# Patient Record
Sex: Female | Born: 1950 | Race: White | Hispanic: No | Marital: Married | Smoking: Never smoker
Health system: Southern US, Community
[De-identification: ages and names within clinical notes are randomized; demographics above are authoritative.]

## PROBLEM LIST (undated history)

## (undated) DIAGNOSIS — Q2112 Patent foramen ovale: Secondary | ICD-10-CM

## (undated) DIAGNOSIS — I10 Essential (primary) hypertension: Secondary | ICD-10-CM

## (undated) DIAGNOSIS — I2510 Atherosclerotic heart disease of native coronary artery without angina pectoris: Secondary | ICD-10-CM

## (undated) DIAGNOSIS — N183 Chronic kidney disease, stage 3 unspecified: Secondary | ICD-10-CM

## (undated) DIAGNOSIS — I252 Old myocardial infarction: Secondary | ICD-10-CM

## (undated) DIAGNOSIS — I7300 Raynaud's syndrome without gangrene: Secondary | ICD-10-CM

## (undated) DIAGNOSIS — I509 Heart failure, unspecified: Secondary | ICD-10-CM

## (undated) DIAGNOSIS — G4733 Obstructive sleep apnea (adult) (pediatric): Secondary | ICD-10-CM

## (undated) DIAGNOSIS — F988 Other specified behavioral and emotional disorders with onset usually occurring in childhood and adolescence: Secondary | ICD-10-CM

## (undated) DIAGNOSIS — R55 Syncope and collapse: Secondary | ICD-10-CM

## (undated) DIAGNOSIS — Z79890 Hormone replacement therapy (postmenopausal): Secondary | ICD-10-CM

## (undated) DIAGNOSIS — F319 Bipolar disorder, unspecified: Secondary | ICD-10-CM

## (undated) DIAGNOSIS — R000 Tachycardia, unspecified: Secondary | ICD-10-CM

## (undated) DIAGNOSIS — I498 Other specified cardiac arrhythmias: Secondary | ICD-10-CM

## (undated) DIAGNOSIS — N289 Disorder of kidney and ureter, unspecified: Secondary | ICD-10-CM

## (undated) DIAGNOSIS — G47429 Narcolepsy in conditions classified elsewhere without cataplexy: Secondary | ICD-10-CM

## (undated) DIAGNOSIS — E039 Hypothyroidism, unspecified: Secondary | ICD-10-CM

## (undated) DIAGNOSIS — R011 Cardiac murmur, unspecified: Secondary | ICD-10-CM

## (undated) DIAGNOSIS — B1920 Unspecified viral hepatitis C without hepatic coma: Secondary | ICD-10-CM

## (undated) DIAGNOSIS — N2581 Secondary hyperparathyroidism of renal origin: Secondary | ICD-10-CM

## (undated) DIAGNOSIS — M797 Fibromyalgia: Secondary | ICD-10-CM

## (undated) DIAGNOSIS — F32A Depression, unspecified: Secondary | ICD-10-CM

## (undated) DIAGNOSIS — Z789 Other specified health status: Secondary | ICD-10-CM

## (undated) DIAGNOSIS — B1910 Unspecified viral hepatitis B without hepatic coma: Secondary | ICD-10-CM

## (undated) DIAGNOSIS — Z8719 Personal history of other diseases of the digestive system: Secondary | ICD-10-CM

## (undated) DIAGNOSIS — F329 Major depressive disorder, single episode, unspecified: Secondary | ICD-10-CM

## (undated) DIAGNOSIS — E785 Hyperlipidemia, unspecified: Secondary | ICD-10-CM

## (undated) DIAGNOSIS — M47819 Spondylosis without myelopathy or radiculopathy, site unspecified: Secondary | ICD-10-CM

## (undated) DIAGNOSIS — E119 Type 2 diabetes mellitus without complications: Secondary | ICD-10-CM

## (undated) DIAGNOSIS — K219 Gastro-esophageal reflux disease without esophagitis: Secondary | ICD-10-CM

## (undated) DIAGNOSIS — J45909 Unspecified asthma, uncomplicated: Secondary | ICD-10-CM

## (undated) DIAGNOSIS — C801 Malignant (primary) neoplasm, unspecified: Secondary | ICD-10-CM

## (undated) DIAGNOSIS — Q211 Atrial septal defect: Secondary | ICD-10-CM

## (undated) DIAGNOSIS — M109 Gout, unspecified: Secondary | ICD-10-CM

## (undated) DIAGNOSIS — G90A Postural orthostatic tachycardia syndrome (POTS): Secondary | ICD-10-CM

## (undated) DIAGNOSIS — I499 Cardiac arrhythmia, unspecified: Secondary | ICD-10-CM

## (undated) DIAGNOSIS — J449 Chronic obstructive pulmonary disease, unspecified: Secondary | ICD-10-CM

## (undated) DIAGNOSIS — L719 Rosacea, unspecified: Secondary | ICD-10-CM

## (undated) DIAGNOSIS — D649 Anemia, unspecified: Secondary | ICD-10-CM

## (undated) DIAGNOSIS — G2581 Restless legs syndrome: Secondary | ICD-10-CM

## (undated) DIAGNOSIS — I951 Orthostatic hypotension: Secondary | ICD-10-CM

## (undated) DIAGNOSIS — E8940 Asymptomatic postprocedural ovarian failure: Secondary | ICD-10-CM

## (undated) HISTORY — DX: Narcolepsy in conditions classified elsewhere without cataplexy: G47.429

## (undated) HISTORY — DX: Type 2 diabetes mellitus without complications: E11.9

## (undated) HISTORY — DX: Depression, unspecified: F32.A

## (undated) HISTORY — DX: Essential (primary) hypertension: I10

## (undated) HISTORY — DX: Other specified behavioral and emotional disorders with onset usually occurring in childhood and adolescence: F98.8

## (undated) HISTORY — DX: Rosacea, unspecified: L71.9

## (undated) HISTORY — PX: BREAST BIOPSY: SHX20

## (undated) HISTORY — PX: TUBAL LIGATION: SHX77

## (undated) HISTORY — PX: CARDIAC CATHETERIZATION: SHX172

## (undated) HISTORY — PX: ABDOMINAL HYSTERECTOMY: SHX81

## (undated) HISTORY — DX: Major depressive disorder, single episode, unspecified: F32.9

## (undated) HISTORY — DX: Restless legs syndrome: G25.81

## (undated) HISTORY — DX: Chronic kidney disease, stage 3 (moderate): N18.3

## (undated) HISTORY — DX: Raynaud's syndrome without gangrene: I73.00

## (undated) HISTORY — PX: FOOT SURGERY: SHX648

## (undated) HISTORY — DX: Unspecified asthma, uncomplicated: J45.909

## (undated) HISTORY — PX: PELVIC FLOOR REPAIR: SHX2192

## (undated) HISTORY — DX: Gastro-esophageal reflux disease without esophagitis: K21.9

## (undated) HISTORY — DX: Other specified health status: Z78.9

## (undated) HISTORY — PX: BLADDER SURGERY: SHX569

## (undated) HISTORY — DX: Spondylosis without myelopathy or radiculopathy, site unspecified: M47.819

## (undated) HISTORY — DX: Asymptomatic postprocedural ovarian failure: E89.40

## (undated) HISTORY — DX: Hormone replacement therapy: Z79.890

## (undated) HISTORY — DX: Secondary hyperparathyroidism of renal origin: N25.81

## (undated) HISTORY — DX: Unspecified viral hepatitis C without hepatic coma: B19.20

## (undated) HISTORY — PX: BREAST SURGERY: SHX581

## (undated) HISTORY — PX: OTHER SURGICAL HISTORY: SHX169

## (undated) HISTORY — DX: Fibromyalgia: M79.7

## (undated) HISTORY — DX: Other specified cardiac arrhythmias: I49.8

## (undated) HISTORY — DX: Old myocardial infarction: I25.2

## (undated) HISTORY — DX: Patent foramen ovale: Q21.12

## (undated) HISTORY — DX: Unspecified viral hepatitis B without hepatic coma: B19.10

## (undated) HISTORY — DX: Atrial septal defect: Q21.1

## (undated) HISTORY — DX: Bipolar disorder, unspecified: F31.9

## (undated) HISTORY — DX: Heart failure, unspecified: I50.9

## (undated) HISTORY — PX: APPENDECTOMY: SHX54

## (undated) HISTORY — DX: Gout, unspecified: M10.9

## (undated) HISTORY — DX: Personal history of other diseases of the digestive system: Z87.19

## (undated) HISTORY — DX: Tachycardia, unspecified: R00.0

## (undated) HISTORY — DX: Chronic kidney disease, stage 3 unspecified: N18.30

## (undated) HISTORY — DX: Hypothyroidism, unspecified: E03.9

## (undated) HISTORY — DX: Orthostatic hypotension: I95.1

## (undated) HISTORY — DX: Obstructive sleep apnea (adult) (pediatric): G47.33

## (undated) HISTORY — DX: Postural orthostatic tachycardia syndrome (POTS): G90.A

## (undated) HISTORY — PX: BUNIONECTOMY: SHX129

## (undated) HISTORY — DX: Atherosclerotic heart disease of native coronary artery without angina pectoris: I25.10

---

## 2006-06-29 DIAGNOSIS — Q2111 Secundum atrial septal defect: Secondary | ICD-10-CM | POA: Insufficient documentation

## 2006-06-29 DIAGNOSIS — E119 Type 2 diabetes mellitus without complications: Secondary | ICD-10-CM | POA: Insufficient documentation

## 2006-12-30 ENCOUNTER — Other Ambulatory Visit: Payer: Self-pay

## 2006-12-30 ENCOUNTER — Emergency Department: Payer: Self-pay | Admitting: Emergency Medicine

## 2007-01-02 ENCOUNTER — Other Ambulatory Visit: Payer: Self-pay

## 2007-01-02 ENCOUNTER — Emergency Department: Payer: Self-pay | Admitting: Internal Medicine

## 2008-07-15 ENCOUNTER — Emergency Department: Payer: Self-pay | Admitting: Unknown Physician Specialty

## 2009-03-18 ENCOUNTER — Emergency Department (HOSPITAL_COMMUNITY): Admission: EM | Admit: 2009-03-18 | Discharge: 2009-03-19 | Payer: Self-pay | Admitting: Emergency Medicine

## 2009-10-16 ENCOUNTER — Emergency Department: Payer: Self-pay | Admitting: Emergency Medicine

## 2009-10-26 ENCOUNTER — Ambulatory Visit: Payer: Self-pay | Admitting: Unknown Physician Specialty

## 2009-11-23 ENCOUNTER — Ambulatory Visit: Payer: Self-pay | Admitting: Specialist

## 2010-04-03 LAB — TSH: TSH: 1.872 u[IU]/mL (ref 0.350–4.500)

## 2010-04-03 LAB — HEPATIC FUNCTION PANEL
Albumin: 3.5 g/dL (ref 3.5–5.2)
Total Protein: 6.5 g/dL (ref 6.0–8.3)

## 2010-04-03 LAB — POCT I-STAT, CHEM 8
Calcium, Ion: 1.18 mmol/L (ref 1.12–1.32)
Chloride: 104 mEq/L (ref 96–112)
HCT: 34 % — ABNORMAL LOW (ref 36.0–46.0)
Potassium: 3.7 mEq/L (ref 3.5–5.1)
Sodium: 139 mEq/L (ref 135–145)

## 2010-04-03 LAB — DIFFERENTIAL
Basophils Relative: 1 % (ref 0–1)
Eosinophils Absolute: 0.2 10*3/uL (ref 0.0–0.7)
Neutrophils Relative %: 66 % (ref 43–77)

## 2010-04-03 LAB — CBC
MCHC: 33.5 g/dL (ref 30.0–36.0)
MCV: 93.6 fL (ref 78.0–100.0)
Platelets: 256 10*3/uL (ref 150–400)
WBC: 6.1 10*3/uL (ref 4.0–10.5)

## 2010-04-03 LAB — BRAIN NATRIURETIC PEPTIDE: Pro B Natriuretic peptide (BNP): <30 pg/mL (ref 0.0–100.0)

## 2010-10-02 DIAGNOSIS — G4733 Obstructive sleep apnea (adult) (pediatric): Secondary | ICD-10-CM | POA: Insufficient documentation

## 2010-10-02 DIAGNOSIS — I10 Essential (primary) hypertension: Secondary | ICD-10-CM | POA: Insufficient documentation

## 2010-10-02 DIAGNOSIS — R55 Syncope and collapse: Secondary | ICD-10-CM | POA: Insufficient documentation

## 2010-10-02 DIAGNOSIS — I2542 Coronary artery dissection: Secondary | ICD-10-CM | POA: Insufficient documentation

## 2010-10-02 DIAGNOSIS — E119 Type 2 diabetes mellitus without complications: Secondary | ICD-10-CM | POA: Insufficient documentation

## 2010-10-02 DIAGNOSIS — I219 Acute myocardial infarction, unspecified: Secondary | ICD-10-CM | POA: Insufficient documentation

## 2010-10-02 DIAGNOSIS — I350 Nonrheumatic aortic (valve) stenosis: Secondary | ICD-10-CM | POA: Insufficient documentation

## 2010-10-02 DIAGNOSIS — J45909 Unspecified asthma, uncomplicated: Secondary | ICD-10-CM | POA: Insufficient documentation

## 2010-10-31 DIAGNOSIS — M21619 Bunion of unspecified foot: Secondary | ICD-10-CM | POA: Insufficient documentation

## 2011-02-15 ENCOUNTER — Emergency Department: Payer: Self-pay | Admitting: Emergency Medicine

## 2011-05-24 DIAGNOSIS — M2040 Other hammer toe(s) (acquired), unspecified foot: Secondary | ICD-10-CM | POA: Insufficient documentation

## 2011-06-15 DIAGNOSIS — K469 Unspecified abdominal hernia without obstruction or gangrene: Secondary | ICD-10-CM | POA: Insufficient documentation

## 2011-06-15 DIAGNOSIS — N393 Stress incontinence (female) (male): Secondary | ICD-10-CM | POA: Insufficient documentation

## 2011-11-27 DIAGNOSIS — D649 Anemia, unspecified: Secondary | ICD-10-CM | POA: Insufficient documentation

## 2011-11-27 DIAGNOSIS — E785 Hyperlipidemia, unspecified: Secondary | ICD-10-CM | POA: Insufficient documentation

## 2011-11-27 DIAGNOSIS — M109 Gout, unspecified: Secondary | ICD-10-CM | POA: Insufficient documentation

## 2011-11-27 DIAGNOSIS — E039 Hypothyroidism, unspecified: Secondary | ICD-10-CM | POA: Insufficient documentation

## 2011-11-27 DIAGNOSIS — J449 Chronic obstructive pulmonary disease, unspecified: Secondary | ICD-10-CM | POA: Insufficient documentation

## 2012-01-18 DIAGNOSIS — K649 Unspecified hemorrhoids: Secondary | ICD-10-CM | POA: Insufficient documentation

## 2012-05-07 DIAGNOSIS — M797 Fibromyalgia: Secondary | ICD-10-CM | POA: Insufficient documentation

## 2012-05-07 DIAGNOSIS — Q249 Congenital malformation of heart, unspecified: Secondary | ICD-10-CM | POA: Insufficient documentation

## 2012-05-07 DIAGNOSIS — N8184 Pelvic muscle wasting: Secondary | ICD-10-CM | POA: Insufficient documentation

## 2012-10-07 DIAGNOSIS — F988 Other specified behavioral and emotional disorders with onset usually occurring in childhood and adolescence: Secondary | ICD-10-CM | POA: Insufficient documentation

## 2013-07-17 DIAGNOSIS — K589 Irritable bowel syndrome without diarrhea: Secondary | ICD-10-CM | POA: Insufficient documentation

## 2014-01-19 ENCOUNTER — Ambulatory Visit: Payer: Self-pay | Admitting: Family Medicine

## 2014-01-20 ENCOUNTER — Ambulatory Visit: Payer: Self-pay | Admitting: Family Medicine

## 2014-03-17 ENCOUNTER — Ambulatory Visit: Payer: Self-pay | Admitting: Family Medicine

## 2014-03-24 ENCOUNTER — Ambulatory Visit: Payer: Self-pay | Admitting: Family Medicine

## 2014-03-25 ENCOUNTER — Ambulatory Visit: Payer: Self-pay | Admitting: Family Medicine

## 2014-04-12 DIAGNOSIS — N1832 Chronic kidney disease, stage 3b: Secondary | ICD-10-CM | POA: Insufficient documentation

## 2014-04-12 DIAGNOSIS — Z789 Other specified health status: Secondary | ICD-10-CM | POA: Insufficient documentation

## 2014-04-12 DIAGNOSIS — N183 Chronic kidney disease, stage 3 unspecified: Secondary | ICD-10-CM | POA: Insufficient documentation

## 2014-04-12 DIAGNOSIS — L719 Rosacea, unspecified: Secondary | ICD-10-CM | POA: Insufficient documentation

## 2014-04-12 DIAGNOSIS — M479 Spondylosis, unspecified: Secondary | ICD-10-CM | POA: Insufficient documentation

## 2014-05-17 ENCOUNTER — Other Ambulatory Visit: Payer: Self-pay | Admitting: Family Medicine

## 2014-05-17 DIAGNOSIS — M25562 Pain in left knee: Secondary | ICD-10-CM

## 2014-06-03 ENCOUNTER — Ambulatory Visit
Admission: RE | Admit: 2014-06-03 | Discharge: 2014-06-03 | Disposition: A | Discharge status: Home / Self Care | Payer: PPO | Source: Ambulatory Visit | Attending: Family Medicine | Admitting: Family Medicine

## 2014-06-03 DIAGNOSIS — M11262 Other chondrocalcinosis, left knee: Secondary | ICD-10-CM | POA: Insufficient documentation

## 2014-06-03 DIAGNOSIS — M25562 Pain in left knee: Secondary | ICD-10-CM | POA: Diagnosis present

## 2014-06-03 DIAGNOSIS — M5136 Other intervertebral disc degeneration, lumbar region: Secondary | ICD-10-CM | POA: Insufficient documentation

## 2014-06-09 ENCOUNTER — Telehealth: Payer: Self-pay | Admitting: Family Medicine

## 2014-06-09 NOTE — Telephone Encounter (Signed)
Sent PA to her Insurance plan via CoverMyMeds on 06/09/2014. Response is pending. Will notify patient once response is received.

## 2014-06-09 NOTE — Telephone Encounter (Signed)
This PA requirement document was in [employer] EMR which was printed out prior to today. Jamie Murillo is aware of the PA and we are working on it but it may take longer than usual.

## 2014-06-09 NOTE — Telephone Encounter (Signed)
Patient states that her adderral (generic brand) is needing authorization from her insurance/pharmacy. Please call when complete.

## 2014-06-10 ENCOUNTER — Other Ambulatory Visit: Payer: Self-pay | Admitting: Physical Medicine and Rehabilitation

## 2014-06-10 DIAGNOSIS — M5416 Radiculopathy, lumbar region: Secondary | ICD-10-CM

## 2014-06-14 ENCOUNTER — Ambulatory Visit
Admission: RE | Admit: 2014-06-14 | Discharge: 2014-06-14 | Disposition: A | Discharge status: Home / Self Care | Payer: PPO | Source: Ambulatory Visit | Attending: Physical Medicine and Rehabilitation | Admitting: Physical Medicine and Rehabilitation

## 2014-06-14 DIAGNOSIS — M5416 Radiculopathy, lumbar region: Secondary | ICD-10-CM

## 2014-06-14 DIAGNOSIS — M5136 Other intervertebral disc degeneration, lumbar region: Secondary | ICD-10-CM | POA: Insufficient documentation

## 2014-06-14 DIAGNOSIS — M545 Low back pain: Secondary | ICD-10-CM | POA: Diagnosis present

## 2014-06-14 DIAGNOSIS — M4806 Spinal stenosis, lumbar region: Secondary | ICD-10-CM | POA: Diagnosis not present

## 2014-06-14 DIAGNOSIS — M79604 Pain in right leg: Secondary | ICD-10-CM | POA: Diagnosis present

## 2014-06-14 DIAGNOSIS — M5126 Other intervertebral disc displacement, lumbar region: Secondary | ICD-10-CM | POA: Insufficient documentation

## 2014-06-14 DIAGNOSIS — M79605 Pain in left leg: Secondary | ICD-10-CM | POA: Diagnosis present

## 2014-06-15 ENCOUNTER — Encounter: Payer: Self-pay | Admitting: Family Medicine

## 2014-06-15 ENCOUNTER — Telehealth: Payer: Self-pay | Admitting: Family Medicine

## 2014-06-15 DIAGNOSIS — G47419 Narcolepsy without cataplexy: Secondary | ICD-10-CM

## 2014-06-15 NOTE — Telephone Encounter (Signed)
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   I have now tried prescribing Adderal generic and Methylphenidate and Nuvigil.   Insurance would not cover Adderal generic or Methylphenidate.   They did cover Nuvigil which the patient has tried and she does not like how it made her feel. Is this the medication she wants to try again?

## 2014-06-15 NOTE — Telephone Encounter (Signed)
Patient called stating her prior authorization for the Adderall ER has been denied so she wants to go back to the original rx with higher dosage if possible.

## 2014-06-16 DIAGNOSIS — N2581 Secondary hyperparathyroidism of renal origin: Secondary | ICD-10-CM | POA: Insufficient documentation

## 2014-06-16 DIAGNOSIS — F329 Major depressive disorder, single episode, unspecified: Secondary | ICD-10-CM | POA: Insufficient documentation

## 2014-06-16 DIAGNOSIS — Q211 Atrial septal defect: Secondary | ICD-10-CM | POA: Insufficient documentation

## 2014-06-16 DIAGNOSIS — I7300 Raynaud's syndrome without gangrene: Secondary | ICD-10-CM | POA: Insufficient documentation

## 2014-06-16 DIAGNOSIS — I2510 Atherosclerotic heart disease of native coronary artery without angina pectoris: Secondary | ICD-10-CM | POA: Insufficient documentation

## 2014-06-16 DIAGNOSIS — Z789 Other specified health status: Secondary | ICD-10-CM | POA: Insufficient documentation

## 2014-06-16 DIAGNOSIS — I5032 Chronic diastolic (congestive) heart failure: Secondary | ICD-10-CM | POA: Insufficient documentation

## 2014-06-16 DIAGNOSIS — K219 Gastro-esophageal reflux disease without esophagitis: Secondary | ICD-10-CM | POA: Insufficient documentation

## 2014-06-16 DIAGNOSIS — G2581 Restless legs syndrome: Secondary | ICD-10-CM | POA: Insufficient documentation

## 2014-06-16 DIAGNOSIS — Z8719 Personal history of other diseases of the digestive system: Secondary | ICD-10-CM | POA: Insufficient documentation

## 2014-06-16 DIAGNOSIS — F4310 Post-traumatic stress disorder, unspecified: Secondary | ICD-10-CM | POA: Insufficient documentation

## 2014-06-16 DIAGNOSIS — G47419 Narcolepsy without cataplexy: Secondary | ICD-10-CM | POA: Insufficient documentation

## 2014-06-16 DIAGNOSIS — I739 Peripheral vascular disease, unspecified: Secondary | ICD-10-CM | POA: Insufficient documentation

## 2014-06-16 DIAGNOSIS — Q2112 Patent foramen ovale: Secondary | ICD-10-CM | POA: Insufficient documentation

## 2014-06-16 DIAGNOSIS — E8940 Asymptomatic postprocedural ovarian failure: Secondary | ICD-10-CM | POA: Insufficient documentation

## 2014-06-16 DIAGNOSIS — M2550 Pain in unspecified joint: Secondary | ICD-10-CM | POA: Insufficient documentation

## 2014-06-16 MED ORDER — AMPHETAMINE-DEXTROAMPHETAMINE 20 MG PO TABS: 20.0000 mg | ORAL_TABLET | Freq: Every day | ORAL | Status: DC

## 2014-06-16 NOTE — Telephone Encounter (Signed)
Patient spoke to her insurance company last night and they informed her that they will cover the generic Adderal and that Dr. Nadine Counts could just increase the dosage. She was told that the Adderal ER was not covered because the diagnosis was not sufficient.

## 2014-06-16 NOTE — Telephone Encounter (Signed)
Contacted patient to inform her that her rx is ready for pick up.

## 2014-06-16 NOTE — Telephone Encounter (Signed)
Generic Adderall 20mg  one a day quantity #30 printed out and ready for patient to pick up.

## 2014-06-18 ENCOUNTER — Other Ambulatory Visit: Payer: Self-pay | Admitting: Family Medicine

## 2014-06-18 DIAGNOSIS — G47419 Narcolepsy without cataplexy: Secondary | ICD-10-CM

## 2014-06-18 MED ORDER — AMPHETAMINE-DEXTROAMPHETAMINE 20 MG PO TABS: 20.0000 mg | ORAL_TABLET | Freq: Two times a day (BID) | ORAL | Status: DC

## 2014-06-19 ENCOUNTER — Encounter: Payer: Self-pay | Admitting: Family Medicine

## 2014-06-20 ENCOUNTER — Encounter: Payer: Self-pay | Admitting: Family Medicine

## 2014-06-21 ENCOUNTER — Encounter: Payer: Self-pay | Admitting: Family Medicine

## 2014-06-21 ENCOUNTER — Other Ambulatory Visit: Payer: Self-pay

## 2014-06-21 ENCOUNTER — Other Ambulatory Visit: Payer: Self-pay | Admitting: Family Medicine

## 2014-06-21 ENCOUNTER — Telehealth: Payer: Self-pay

## 2014-06-21 DIAGNOSIS — F988 Other specified behavioral and emotional disorders with onset usually occurring in childhood and adolescence: Secondary | ICD-10-CM

## 2014-06-21 DIAGNOSIS — G47419 Narcolepsy without cataplexy: Secondary | ICD-10-CM

## 2014-06-21 DIAGNOSIS — L719 Rosacea, unspecified: Secondary | ICD-10-CM

## 2014-06-21 MED ORDER — MINOCYCLINE HCL 100 MG PO CAPS: 100.0000 mg | ORAL_CAPSULE | Freq: Every day | ORAL | Status: DC

## 2014-06-21 NOTE — Telephone Encounter (Signed)
error 

## 2014-07-08 ENCOUNTER — Encounter: Payer: Self-pay | Admitting: Psychiatry

## 2014-07-08 ENCOUNTER — Encounter: Payer: Self-pay | Admitting: Family Medicine

## 2014-07-08 ENCOUNTER — Ambulatory Visit (INDEPENDENT_AMBULATORY_CARE_PROVIDER_SITE_OTHER): Payer: PPO | Admitting: Psychiatry

## 2014-07-08 VITALS — BP 118/76 | HR 76 | Temp 97.8°F | Ht 62.0 in | Wt 160.6 lb

## 2014-07-08 DIAGNOSIS — F330 Major depressive disorder, recurrent, mild: Secondary | ICD-10-CM

## 2014-07-08 DIAGNOSIS — F909 Attention-deficit hyperactivity disorder, unspecified type: Secondary | ICD-10-CM | POA: Diagnosis not present

## 2014-07-08 DIAGNOSIS — F988 Other specified behavioral and emotional disorders with onset usually occurring in childhood and adolescence: Secondary | ICD-10-CM
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
## 2014-07-09 ENCOUNTER — Other Ambulatory Visit: Payer: Self-pay | Admitting: Family Medicine

## 2014-07-09 DIAGNOSIS — G47419 Narcolepsy without cataplexy: Secondary | ICD-10-CM

## 2014-07-09 MED ORDER — AMPHETAMINE-DEXTROAMPHETAMINE 20 MG PO TABS: 20.0000 mg | ORAL_TABLET | Freq: Two times a day (BID) | ORAL | Status: DC

## 2014-07-19 ENCOUNTER — Ambulatory Visit: Payer: Self-pay | Admitting: Psychiatry

## 2014-07-21 ENCOUNTER — Institutional Professional Consult (permissible substitution): Payer: Self-pay | Admitting: Neurology

## 2014-07-21 ENCOUNTER — Ambulatory Visit (INDEPENDENT_AMBULATORY_CARE_PROVIDER_SITE_OTHER): Payer: PPO | Admitting: Neurology

## 2014-07-21 ENCOUNTER — Encounter: Payer: Self-pay | Admitting: Diagnostic Neuroimaging

## 2014-07-21 ENCOUNTER — Encounter: Payer: Self-pay | Admitting: Neurology

## 2014-07-21 ENCOUNTER — Encounter (INDEPENDENT_AMBULATORY_CARE_PROVIDER_SITE_OTHER): Payer: PPO | Admitting: Diagnostic Neuroimaging

## 2014-07-21 VITALS — BP 107/63 | HR 76 | Resp 20 | Ht 62.0 in | Wt 161.8 lb

## 2014-07-21 DIAGNOSIS — G4710 Hypersomnia, unspecified: Secondary | ICD-10-CM | POA: Insufficient documentation

## 2014-07-21 DIAGNOSIS — Z9989 Dependence on other enabling machines and devices: Secondary | ICD-10-CM

## 2014-07-21 DIAGNOSIS — G2581 Restless legs syndrome: Secondary | ICD-10-CM | POA: Diagnosis not present

## 2014-07-21 DIAGNOSIS — G4730 Sleep apnea, unspecified: Secondary | ICD-10-CM

## 2014-07-21 DIAGNOSIS — G47411 Narcolepsy with cataplexy: Secondary | ICD-10-CM | POA: Diagnosis not present

## 2014-07-21 DIAGNOSIS — G4733 Obstructive sleep apnea (adult) (pediatric): Secondary | ICD-10-CM | POA: Insufficient documentation

## 2014-07-21 MED ORDER — IMIPRAMINE PAMOATE 75 MG PO CAPS: 75.0000 mg | ORAL_CAPSULE | Freq: Every day | ORAL | Status: DC

## 2014-07-21 MED ORDER — MODAFINIL 200 MG PO TABS: 200.0000 mg | ORAL_TABLET | Freq: Every day | ORAL | Status: DC

## 2014-07-21 NOTE — Progress Notes (Signed)
SLEEP MED[facility]INE CL[facility]IC   Provider:  Larey Seat, M D  Referring Provider: Bobetta Lime, MD Primary Care Physician:  Bobetta Lime, MD  Chief Complaint  Patient presents with  . sleep consult    rm 11, confirmed narcolepsy wth MSLT, alone    HPI:  Jamie Murillo is a 64 y.o. female  Is seen here as a referral from Dr. Nadine Counts . This is [place] has used CPAP since 2011 the studies I have available here are from 2016 generally and show that she is excessively hypersomnia or excessively daytime sleepy by being sufficiently treated on CPAP. The CPAP interpretation by Dr. Domenica Fail go showed a sleep time of 376.5 minutes a sleep latency of 15 minutes sleep efficiency of only 81.1% REM proportion was 28.3% and the AHI was only 5.4 per hour the RDI was 6.4 per hour. During REM sleep was increased to 10.1 and in supine position 5.4 AHI. The patient had borderline bradycardia average heart rate was 68.3 however. There were no periodic limb movements and she was titrated to 9 cm water pressure she also was tried on 10 cm water pressure and the technologist noted that she seemed to do better. It was recommended to use 10 cm water pressure CPAP as the most optimal pressure for her. Patient then was required to stay the next day on 01-20-14 for an MS LT interestingly she was given 5 nap opportunities sleep onsets were supposedly within less than a minute in each of her periods and she had REM onset supposedly and 4 out of 5 naps. 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 Is treated with CPAP prior to the MS LT so the has to be no need to re-titrate her.    Jamie Murillo is here today endorsing the Epworth sleepiness score 21/ 24 and endorsed the fatigue severity score at 63 pints. The patient has voluntarily stopped to drive.   Patient goes to bed between 10 and 11 PM, adheres to sleep hygiene rules. She sleeeps  supine, because of the CPAP . She was never tried on a nasal pillow.  She sleeps in a cool, quiet, and dark room, uses CPAP every night/  Sometimes she will find her self sleep walking, eating, . She will get up about 4.30 to help her husband to get ready, sh is retired.  She struggles all day with hypersomnia. She drinks 2 caffeineated sodas. Ice tea. No ETOH , no tobacco use.   She does not take scheduled naps, and had insomnia before. The current degree of sleepiness begun 3- years ago. In the last 18 month she gained 35 pounds!        Review of Systems: Out of a complete 14 system review, the patient complains of only the following symptoms, and all other reviewed systems are negative. Snoring when not using CPAP.  She reports very vivid dreams sometimes very real appearing dreams but also wake her up and fragment her sleep. As experienced sleep paralysis more than once, hypnagogic and hypnopompic hallucinations. Dream intrusions. Screaming in her dreams. Acting out dreams. When she is in emotionally taxing or upsetting situation , she noticed her knees to buckle.  The patient (anecdotal but she saw her little daughter touching the hot stove top with her hand and in her emotional shock she was not able to go to her daughter first she screamed out for her husband to get her.  So this would be a cataplectic example. When she was still driving she would chew  gum , pinch herself, and if the windows open and the radio on full blast -  all to no avail as she still would fall asleep.  Narcolepsy with catapley.  Her machine is broken. Marland Kitchen      History   Social History  . Marital Status: Married    Spouse Name: Philippa Chester  . Number of Children: 3  . Years of Education: 22   Occupational History  . unemployed    Social History Main Topics  . Smoking status: Never Smoker   . Smokeless tobacco: Never Used     Comment: tried smoking in youth  . Alcohol Use: No     Comment: used when she was  64 years old.   . Drug Use: Yes    Special: LSD, Marijuana     Comment: 07/21/14 "tried in youth"  . Sexual Activity: Yes    Birth Control/ Protection: None   Other Topics Concern  . Not on file   Social History Narrative   Married, lives at home with husband   Caffeine use- 1 soda, 1 tea daily    Family History  Problem Relation Age of Onset  . Hypertension Mother   . Hypothyroidism Mother   . Heart disease Mother   . Lung cancer Mother   . Alcohol abuse Mother   . Depression Mother   . Hypertension Father   . Heart disease Father   . Alcohol abuse Father   . Hypothyroidism Sister   . Hypertension Sister   . Bipolar disorder Sister   . Heart disease Brother   . Alcohol abuse Brother   . Bipolar disorder Brother   . Hypothyroidism Sister   . Hypertension Sister     Past Medical History  Diagnosis Date  . History of chronic hepatitis     Hepatitis B and C  . Raynaud's disease without gangrene   . Gout   . Secondary hyperparathyroidism, renal   . Postural orthostatic tachycardia syndrome   . Coronary artery disease with history of myocardial infarction without history of CABG   . Surgical menopause on hormone replacement therapy   . Restless leg syndrome, controlled   . CHF NYHA class I     chronic, diastolic  . 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 GERD without esophagitis   . Hypothyroidism, adult   . Chronic renal impairment, stage 3 (moderate)   . Obstructive sleep apnea, adult     CPAP  . Statin intolerance   . Rosacea   . Patent foramen ovale   . Chronic asthma   . Adult attention deficit disorder   . Bipolar disorder   . Depression   . Hepatitis B   . Hepatitis C   . Hypertension   . Diabetes mellitus without complication   . Fibromyalgia     Past Surgical History  Procedure Laterality Date  . Foot surgery Bilateral   . Breast surgery    . Pelvic floor  repair    . Abdominal hysterectomy    . Bilateral tubal ligation    . Bladder surgery      "repair"  . Bunionectomy Bilateral     Current Outpatient Prescriptions  Medication Sig Dispense Refill  . albuterol (PROAIR HFA) 108 (90 BASE) MCG/ACT inhaler Inhale into the lungs.    Marland Kitchen albuterol (  PROVENTIL) (2.5 MG/3ML) 0.083% nebulizer solution Inhale into the lungs.    Marland Kitchen allopurinol (ZYLOPRIM) 100 MG tablet Take by mouth.    Marland Kitchen ammonium lactate (LAC-HYDRIN) 12 % cream LAC-HYDRIN, 12% (External Cream) - Historical Medication  (12 %) Active    . amphetamine-dextroamphetamine (ADDERALL) 20 MG tablet Take 1 tablet (20 mg total) by mouth 2 (two) times daily. 60 tablet 0  . clindamycin (CLINDAGEL) 1 % gel CLINDAMYCIN PHOSPHATE, 1% (External Gel) - Historical Medication  (1 %) Active    . estradiol (ESTRACE) 0.5 MG tablet   0  . furosemide (LASIX) 20 MG tablet   0  . gabapentin (NEURONTIN) 300 MG capsule Take 600 mg by mouth at bedtime.  0  . levothyroxine (SYNTHROID, LEVOTHROID) 25 MCG tablet Take by mouth.    . losartan (COZAAR) 50 MG tablet Take by mouth.    . metoprolol succinate (TOPROL-XL) 50 MG 24 hr tablet   0  . minocycline (MINOCIN,DYNACIN) 100 MG capsule Take 1 capsule (100 mg total) by mouth daily. 90 capsule 1  . montelukast (SINGULAIR) 10 MG tablet Take 10 mg by mouth.    . Pramipexole Dihydrochloride 1.5 MG TB24 Take by mouth.    . SYMBICORT 160-4.5 MCG/ACT inhaler   0  . traMADol (ULTRAM) 50 MG tablet Take by mouth.    . tretinoin (RETIN-A) 0.05 % cream      No current facility-administered medications for this visit.    Allergies as of 07/21/2014 - Review Complete 07/21/2014  Allergen Reaction Noted  . Bee venom Anaphylaxis 07/08/2014  . Desvenlafaxine Rash and Shortness Of Breath 07/08/2014  . Diltiazem hcl Anaphylaxis 07/08/2014  . Neomycin Anaphylaxis 07/08/2014  . Other Anaphylaxis 07/08/2014  . Pneumococcal polysaccharide vaccine Dermatitis, Rash, and Swelling  07/08/2014  . Prednisone Anaphylaxis 07/08/2014  . Latex Rash 07/08/2014  . Ampicillin Hives 07/08/2014  . Erythromycin Hives 07/08/2014  . Influenza vaccines  07/08/2014  . Lamotrigine Hives 07/08/2014  . Penicillins Nausea And Vomiting 07/08/2014  . Pneumococcal vaccine Hives 07/08/2014  . Tetracycline Nausea And Vomiting 07/08/2014  . Buspirone Rash 07/08/2014  . Cortisone Rash 07/08/2014  . Imipramine Rash 07/08/2014  . Statins Rash 07/08/2014  . Tape Rash 07/08/2014  . Ziprasidone hcl Rash 07/08/2014    Vitals: BP 107/63 mmHg  Pulse 76  Resp 20  Ht 5\' 2"  (1.575 m)  Wt 161 lb 12.8 oz (73.392 kg)  BMI 29.59 kg/m2 Last Weight:  Wt Readings from Last 1 Encounters:  07/21/14 161 lb 12.8 oz (73.392 kg)       Last Height:   Ht Readings from Last 1 Encounters:  07/21/14 5\' 2"  (1.575 m)    Physical exam:  General: The patient is awake, alert and appears not in acute distress. The patient is well groomed. Head: Normocephalic, atraumatic. Neck is supple. Mallampati 3 , she has upper and lower dentures. ,  neck circumference: 15 . Nasal airflow unrestricted , TMJ is evident . Retrognathia is not seen.  Cardiovascular:  Regular rate and rhythm, without  murmurs or carotid bruit, and without distended neck veins. Respiratory: Lungs are clear to auscultation. Skin:  Without evidence of edema, or rash Trunk: BMI is elevated and patient  has normal posture.  Neurologic exam : The patient is awake and alert, oriented to place and time.   Memory subjective  described as intact. There is a normal attention span & concentration ability. Speech is fluent without  dysarthria, dysphonia or aphasia.  Mood and affect are  appropriate.  Cranial nerves: Pupils are equal and briskly reactive to light. Funduscopic exam without evidence of pallor or edema. Extraocular movements  in vertical and horizontal planes intact and without nystagmus. Visual fields by finger perimetry are  intact. Hearing to finger rub intact.  Facial sensation intact to fine touch. Facial motor strength is symmetric and tongue and uvula move midline.  Motor exam: Normal tone ,muscle bulk and symmetric ,strength in all extremities.  Sensory:  Fine touch, pinprick and vibration were tested in all extremities. Proprioception normal.  Coordination: Rapid alternating movements in the fingers/hands is normal.  Finger-to-nose maneuver normal without evidence of ataxia, dysmetria or tremor.  Gait and station: Patient walks without assistive device and is able unassisted to climb up to the exam table.  Strength within normal limits. Stance is stable and normal. Tandem gait is unfragmented. Romberg testing is negative.  Deep tendon reflexes: in the  upper and lower extremities are symmetric and intact. Babinski maneuver response is  downgoing.   Assessment:  After physical and neurologic examination, review of laboratory studies, imaging, neurophysiology testing and pre-existing records, assessment is   Jamie Murillo describes that she has suffered from obstructive sleep apnea for about 6 years. She is even noticed to snore when she wears her CPAP. The current CPAP pressure seems to be sufficient to reduce her AHI and she was recommended to use 10 cm water pressure but her current machine does not have correct downloadable abilities. The DME is upper via and she has not had regular downloaded data through DME. I'm unable to find a sleep study and the current chart that documents her sleep apnea untreated both studies that I could compare were CPAP re-titration's and an M SLT. The M SLT is very suspicious for narcolepsy and her clinical symptoms for cataplexy as well. She has not responded well to Adderall and I think she needs to be placed on modafinil and perhaps even Xyrem to achieve better control. Modafinil will only address the sleepiness and fatigue component but it will not address cataplexy. For this  part of the symptom complex we usually use imipramine in form of Tofranil. Her current degree of sleepiness would leave her unsafe to drive so medication and treatment change is necessary ASAP. The patient also advised me that she is allergic to imipramine. She developed a rash and a tightness of the throat when she took it the last time.   The patient was advised of the nature of the diagnosed sleep disorder , the treatment options and risks for general a health and wellness arising from not treating the condition. Visit duration was 40  minutes.   Plan:  Treatment plan and additional workup :  #1 the patient has documented sleep apnea but I don't know her baseline values. Her current CPAP machine is not working well she actually has broken parts. I would like for her to receive a new machine if possible. In addition her DME company is apnea and I will write for new supplies for her CPAP I would like for her to be fitted with a nasal pillow or nasal mask to see if this can further reduce her apnea index. This would also allow her not to sleep in supine but to resume a sleep position on the side or even on the belly. Normal many of my patients do respond well to a dream wear mask.   UNSURE , if currently covered by her Medicare plan health team advantage ;  Given  the M SLT results I do think that there is little doubt about her diagnosis of narcolepsy. What I will try today is to write for modafinil in form of Provigil or Nuvigil prefer generic form 200 mg once a day and foot Tofranil in p.m. only. I would like to see Jamie Murillo again in about a month see how she is doing on this medication and if she can tolerate the Tofranil at all if not it would be necessary for me to change her to Xyrem.  In addition Jamie Murillo carries a diagnosis of restless leg syndrome. She has been well treated with pramipexole Mirapex, she has been on higher doses in the past currently she is only taking 1 mg at night down  from 1.542 before. I am concerned that the restless leg component is actually a primary movement disorder and not truly restless legs and may be related to either subtherapeutic CPAP use or to the narcolepsy disorder.     Asencion Partridge Shay Bartoli MD  07/21/2014

## 2014-07-21 NOTE — Patient Instructions (Signed)
Narcolepsy Narcolepsy is a nervous system disorder that causes daytime sleepiness and sudden bouts of irresistible sleep during the day (sleep attacks). Many people with narcolepsy live with extreme daytime sleepiness for many years before being diagnosed and treated. Narcolepsy is a lifelong (chronic) disorder.  You normally go through cycles when you sleep. When your sleep becomes deeper, you have less body movement, and you start dreaming. This type of deep sleep should happen after about 90 minutes of lighter sleep. Deep sleep is called rapid eye movement (REM) sleep. When you have narcolepsy, REM is not well regulated. It can happen as soon as you fall asleep, and components of REM sleep can occur during the day when you are awake. Uncontrolled REM sleep causes symptoms of narcolepsy. CAUSES Narcolepsy may be caused by an abnormality with a chemical messenger (neurotransmitter) in your brain that controls your sleep and wake cycles. Most people with narcolepsy have low levels of the neurotransmitter hypocretin. Hypocretin is important for controlling wakefulness.  A hypocretin imbalance may be caused by abnormal genes that are passed down through families. It may also develop if the body's defense system (immune system) mistakenly attacks the brain cells that produce hypocretin (autoimmune disease). RISK FACTORS You may be at higher risk for narcolepsy if you have a family history of the disease. Other risk factors that may contribute include:  Injuries, tumors, or infections in the areas of the brain that control sleep.  Exposure to toxins.  Stress.  Hormones produced during puberty and menopause.  Poor sleep habits. SIGNS AND SYMPTOMS  Symptoms of narcolepsy can start at any age but usually begin when people are between the ages of 7 and 25 years. There are four major symptoms. Not everyone with narcolepsy will have all four.   Excessive daytime sleepiness. This is the most common symptom  and is usually the first symptom you will notice.  You may feel as if you are in a mental fog.  Daytime sleepiness may severely affect your performance at work or school.  You may fall asleep during a conversation or while eating dinner.  Sudden loss of muscle tone (cataplexy). You do not lose consciousness, but you may suddenly lose muscle control. When this occurs, your speech may become slurred, or your knees may buckle. This symptom is usually triggered by surprise, anger, or laughter.  Sleep paralysis. You may lose the ability to speak or move just as you start to fall asleep or wake up. You will be aware of the paralysis. It usually lasts for just a few seconds or minutes.  Vivid hallucinations. These may occur with sleep paralysis. The hallucinations are like having bizarre or frightening dreams while you are still awake. Other symptoms may include:   Trouble staying asleep at night (insomnia).  Restless sleep.  Feeling a strong urge to get up at night to smoke or eat. DIAGNOSIS  Your health care provider can diagnose narcolepsy based on your symptoms and the results of two diagnostic tests. You may also be asked to keep a sleep diary for several weeks. You may need the tests if you have had daytime sleepiness for at least 3 months. The two tests are:   A polysomnogram to find out how well your REM sleep is regulated at night. This test is an overnight sleep study. It measures your heart rate, breathing, movement, and brain waves.  A multiple sleep latency test (MSLT) to find out how well your REM sleep is regulated during the day. This   is a daytime sleep study. You may need to take several naps during the day. This test also measures your heart rate, breathing, movement, and brain waves. TREATMENT  There is no cure for narcolepsy, but treatment can be very effective in helping manage the condition. Treatment may include:  Lifestyle and sleeping strategies to help cope with the  condition.  Medicines. These may include:  Stimulant medicines to fight daytime sleepiness.  Antidepressant medicines to treat cataplexy.  Sodium oxybate. This is a strong sedative that you take at night. It can help daytime sleepiness and cataplexy. HOME CARE INSTRUCTIONS  Take all medicines as directed by your health care provider.  Follow these sleep practices:  Try to get about 8 hours of sleep every night.  Go to sleep and get up close to the same time every day.  Keep your bedroom dark, quiet, and comfortable.  Schedule short naps for when you feel sleepiest during the day. Tell your employer or teachers that you have narcolepsy. You may be able to adjust your schedule to include time for naps.  Try to get at least 20 minutes of exercise every day. This will help you sleep better at night and reduce daytime sleepiness.  Do not drink alcohol or caffeinated beverages within 4-5 hours of bedtime.  Do not eat a heavy meal before bedtime. Eat at about the same times every day.  Do not smoke.  Do not drive if you are sleepy or have untreated narcolepsy.  Do not swim or go out on the water without a life jacket. SEEK MEDICAL CARE IF: Your medicines are not controlling your narcolepsy symptoms. SEEK IMMEDIATE MEDICAL CARE IF:  You hurt yourself during a sleep attack or an attack of cataplexy.  You have chest pain or trouble breathing. Document Released: 12/15/2001 Document Revised: 05/11/2013 Document Reviewed: 12/17/2012 [facility] Patient Information 2015 [place], [place]. This information is not intended to replace advice given to you by your health care provider. Make sure you discuss any questions you have with your health care provider. Restless Legs Syndrome Restless legs syndrome is a movement disorder. It may also be called a sensorimotor disorder.  CAUSES  No one knows what specifically causes restless legs syndrome, but it tends to run in families. It is also more  common in people with low iron, in pregnancy, in people who need dialysis, and those with nerve damage (neuropathy).Some medications may make restless legs syndrome worse.Those medications include drugs to treat high blood pressure, some heart conditions, nausea, colds, allergies, and depression. SYMPTOMS Symptoms include uncomfortable sensations in the legs. These leg sensations are worse during periods of inactivity or rest. They are also worse while sitting or lying down. Individuals that have the disorder describe sensations in the legs that feel like:  Pulling.  Drawing.  Crawling.  Worming.  Boring.  Tingling.  Pins and needles.  Prickling.  Pain. The sensations are usually accompanied by an overwhelming urge to move the legs. Sudden muscle jerks may also occur. Movement provides temporary relief from the discomfort. In rare cases, the arms may also be affected. Symptoms may interfere with going to sleep (sleep onset insomnia). Restless legs syndrome may also be related to periodic limb movement disorder (PLMD). PLMD is another more common motor disorder. It also causes interrupted sleep. The symptoms from PLMD usually occur most often when you are awake. TREATMENT  Treatment for restless legs syndrome is symptomatic. This means that the symptoms are treated.   Massage and cold compresses  may provide temporary relief.  Walk, stretch, or take a cold or hot bath.  Get regular exercise and a good night's sleep.  Avoid caffeine, alcohol, nicotine, and medications that can make it worse.  Do activities that provide mental stimulation like discussions, needlework, and video games. These may be helpful if you are not able to walk or stretch. Some medications are effective in relieving the symptoms. However, many of these medications have side effects. Ask your caregiver about medications that may help your symptoms. Correcting iron deficiency may improve symptoms for some  patients. Document Released: 12/15/2001 Document Revised: 05/11/2013 Document Reviewed: 03/23/2010 [facility] Patient Information 2015 [place], [place]. This information is not intended to replace advice given to you by your health care provider. Make sure you discuss any questions you have with your health care provider.

## 2014-07-22 ENCOUNTER — Encounter: Payer: Self-pay | Admitting: Neurology

## 2014-07-23 ENCOUNTER — Other Ambulatory Visit: Payer: Self-pay | Admitting: Specialist

## 2014-07-23 ENCOUNTER — Other Ambulatory Visit: Payer: Self-pay

## 2014-07-23 DIAGNOSIS — I10 Essential (primary) hypertension: Secondary | ICD-10-CM

## 2014-07-23 DIAGNOSIS — M25562 Pain in left knee: Secondary | ICD-10-CM

## 2014-07-23 MED ORDER — METOPROLOL SUCCINATE ER 50 MG PO TB24: 50.0000 mg | ORAL_TABLET | Freq: Every day | ORAL | Status: DC

## 2014-07-23 NOTE — Telephone Encounter (Signed)
Refill request was sent to Dr. Ashany Sundaram for approval and submission.  

## 2014-07-26 NOTE — Progress Notes (Signed)
This encounter was created in error - please disregard.

## 2014-07-28 ENCOUNTER — Ambulatory Visit
Admission: RE | Admit: 2014-07-28 | Discharge: 2014-07-28 | Disposition: A | Discharge status: Home / Self Care | Payer: PPO | Source: Ambulatory Visit | Attending: Specialist | Admitting: Specialist

## 2014-07-28 DIAGNOSIS — M25462 Effusion, left knee: Secondary | ICD-10-CM | POA: Insufficient documentation

## 2014-07-28 DIAGNOSIS — M25562 Pain in left knee: Secondary | ICD-10-CM

## 2014-07-29 ENCOUNTER — Encounter: Payer: Self-pay | Admitting: Neurology

## 2014-07-30 ENCOUNTER — Encounter: Payer: Self-pay | Admitting: Neurology

## 2014-08-09 ENCOUNTER — Telehealth: Payer: Self-pay

## 2014-08-09 ENCOUNTER — Other Ambulatory Visit: Payer: Self-pay

## 2014-08-09 MED ORDER — AMPHETAMINE-DEXTROAMPHET ER 20 MG PO CP24: 20.0000 mg | ORAL_CAPSULE | Freq: Every day | ORAL | Status: DC

## 2014-08-09 NOTE — Telephone Encounter (Signed)
Pt's RX is ready for pick up at the front desk.

## 2014-08-11 ENCOUNTER — Encounter: Payer: Self-pay | Admitting: Neurology

## 2014-08-11 NOTE — Telephone Encounter (Signed)
Pt requesting her cpap referral be sent to [facility]. Sent pages to them, faxed to [phone].

## 2014-08-12 ENCOUNTER — Other Ambulatory Visit: Payer: Self-pay

## 2014-08-12 DIAGNOSIS — J4530 Mild persistent asthma, uncomplicated: Secondary | ICD-10-CM

## 2014-08-12 MED ORDER — ALBUTEROL SULFATE HFA 108 (90 BASE) MCG/ACT IN AERS: 1.0000 | INHALATION_SPRAY | RESPIRATORY_TRACT | Status: AC | PRN

## 2014-09-02 ENCOUNTER — Ambulatory Visit (INDEPENDENT_AMBULATORY_CARE_PROVIDER_SITE_OTHER): Payer: PPO | Admitting: Neurology

## 2014-09-02 ENCOUNTER — Encounter: Payer: Self-pay | Admitting: Neurology

## 2014-09-02 ENCOUNTER — Telehealth: Payer: Self-pay | Admitting: Neurology

## 2014-09-02 ENCOUNTER — Other Ambulatory Visit: Payer: Self-pay

## 2014-09-02 VITALS — BP 126/78 | HR 76 | Resp 20 | Ht 64.0 in | Wt 168.0 lb

## 2014-09-02 DIAGNOSIS — G2581 Restless legs syndrome: Secondary | ICD-10-CM

## 2014-09-02 DIAGNOSIS — G4733 Obstructive sleep apnea (adult) (pediatric): Secondary | ICD-10-CM | POA: Diagnosis not present

## 2014-09-02 DIAGNOSIS — G47411 Narcolepsy with cataplexy: Secondary | ICD-10-CM | POA: Diagnosis not present

## 2014-09-02 DIAGNOSIS — Z9989 Dependence on other enabling machines and devices: Secondary | ICD-10-CM

## 2014-09-02 DIAGNOSIS — R51 Headache: Secondary | ICD-10-CM | POA: Diagnosis not present

## 2014-09-02 DIAGNOSIS — R519 Headache, unspecified: Secondary | ICD-10-CM

## 2014-09-02 MED ORDER — SODIUM OXYBATE 500 MG/ML PO SOLN: 2750.0000 mg | Freq: Two times a day (BID) | ORAL | Status: DC

## 2014-09-02 NOTE — Progress Notes (Signed)
[facility]   Provider:  Larey Seat, M D  Referring Provider: Bobetta Lime, MD Primary Care Physician:  Idelle Crouch, MD  Chief Complaint  Patient presents with  . Follow-up    cpap, rm 10, alone    HPI:  Nekesha Font is a 64 y.o. female  Is seen here as a referral from Dr. Nadine Counts .  Mrs. Weinmann has used CPAP since 2011 the studies I have available here are from 2016 generally and show that she is excessively hypersomnia or excessively daytime sleepy by being sufficiently treated on CPAP. The CPAP interpretation showed a sleep time of 376.5 minutes , a sleep latency of 15 minutes , a sleep efficiency of only 81.1% , REM proportion was 28.3% and the AHI was only 5.4 per hour,  the RDI was 6.4 per hour.  During REM sleep AHI  was increased to 10.1 and in supine position 5.4 AHI.  The patient had borderline bradycardia , with an average heart rate was 68. BPM  however. There were no periodic limb movements . She was titrated to 9 cm water pressure she also was tried on 10 cm water pressure and the technologist noted that she seemed to do better.  It was recommended to use 10 cm water pressure CPAP as the most optimal pressure for her. Patient then was required to stay the next day on 01-20-14 for an MS LT interestingly she was given 5 nap opportunities sleep onsets were supposedly within less than a minute in each of her periods and she had REM onset supposedly and 4 out of 5 naps. 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  Patient is  treated with CPAP prior to the MS LT so the has to be no need to re-titrate her.  Patient goes to bed between 10 and 11 PM, adheres to sleep hygiene rules. She sleeeps supine, because of the CPAP . She was never tried on a nasal pillow.  She sleeps in a cool, quiet, and dark room, uses CPAP every night/  Sometimes she will find her self sleep  walking, eating, . She will get up about 4.30 to help her husband to get ready, sh is retired.  She struggles all day with hypersomnia. She drinks 2 caffeineated sodas. Ice tea. No ETOH , no tobacco use.   She does not take scheduled naps, and had insomnia before. The current degree of sleepiness begun 3- years ago. In the last 18 month she gained 35 pounds!   Interval history from 09-02-14- I still have no access to a baseline sleep study for this patient, and I'm given data from as early as 2016 reflecting a CPAP titration only but no baseline study. Given the difficulties we have to control the patient's excessive daytime sleepiness, she still endorses the Epworth Sleepiness Scale at 22 points and the fatigue severity scale at 61 points, it would have been helpful to at least obtain a download. These data are not available from her machine either. She is using currently a full face mask, AIR fit F , 10 by [employer]. Given her reported history of imipramine allergy she was not able to take Tofranil she stated. So I have no cataplexy controlling medication for this patient. She carries a diagnosis of ADD and the use of stimulants can treat both excessive daytime sleepiness and ADD, but it is not an optimal treatment option for a  lady of 64 years of age. Especially with heart disease  Blood pressure, obesity etc.         Review of Systems: Mrs. Marquess is here today endorsing the Epworth sleepiness score 22/ 24 and endorsed the fatigue severity score at 61 points. The patient has voluntarily stopped to drive.   Out of a complete 14 system review, the patient complains of only the following symptoms, and all other reviewed systems are negative. Snoring when not using CPAP.  She reports very vivid dreams  (sometimes very real appearing dreams ) . These  wake her up and fragment her sleep.  She experienced sleep paralysis more than once, hypnagogic and hypnopompic hallucinations. Dream intrusions.  Screaming in her dreams.  Acting out dreams. When she is in emotionally taxing or upsetting situation , she noticed her knees to buckle.  The patient (anecdotal but she saw her little daughter touching the hot stove top with her hand and in her emotional shock she was not able to go to her daughter )first she screamed out for her husband to get her. So this would be a cataplectic example. When she was still driving she would chew gum , pinch herself, and keep the car's windows open and the radio on full blast -  all to no avail as she still would fall asleep.       Social History   Social History  . Marital Status: Married    Spouse Name: Philippa Chester  . Number of Children: 3  . Years of Education: 22   Occupational History  . unemployed    Social History Main Topics  . Smoking status: Never Smoker   . Smokeless tobacco: Never Used     Comment: tried smoking in youth  . Alcohol Use: No     Comment: used when she was 64 years old.   . Drug Use: Yes    Special: LSD, Marijuana     Comment: 07/21/14 "tried in youth"  . Sexual Activity: Yes    Birth Control/ Protection: None   Other Topics Concern  . Not on file   Social History Narrative   Married, lives at home with husband   Caffeine use- 1 soda, 1 tea daily    Family History  Problem Relation Age of Onset  . Hypertension Mother   . Hypothyroidism Mother   . Heart disease Mother   . Lung cancer Mother   . Alcohol abuse Mother   . Depression Mother   . Hypertension Father   . Heart disease Father   . Alcohol abuse Father   . Hypothyroidism Sister   . Hypertension Sister   . Bipolar disorder Sister   . Heart disease Brother   . Alcohol abuse Brother   . Bipolar disorder Brother   . Hypothyroidism Sister   . Hypertension Sister     Past Medical History  Diagnosis Date  . History of chronic hepatitis     Hepatitis B and C  . Raynaud's disease without gangrene   . Gout   . Secondary hyperparathyroidism, renal   .  Postural orthostatic tachycardia syndrome   . Coronary artery disease with history of myocardial infarction without history of CABG   . Surgical menopause on hormone replacement therapy   . Restless leg syndrome, controlled   . CHF NYHA class I     chronic, diastolic  . 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  GERD without esophagitis   . Hypothyroidism, adult   . Chronic renal impairment, stage 3 (moderate)   . Obstructive sleep apnea, adult     CPAP  . Statin intolerance   . Rosacea   . Patent foramen ovale   . Chronic asthma   . Adult attention deficit disorder   . Bipolar disorder   . Depression   . Hepatitis B   . Hepatitis C   . Hypertension   . Diabetes mellitus without complication   . Fibromyalgia     Past Surgical History  Procedure Laterality Date  . Foot surgery Bilateral   . Breast surgery    . Pelvic floor repair    . Abdominal hysterectomy    . Bilateral tubal ligation    . Bladder surgery      "repair"  . Bunionectomy Bilateral     Current Outpatient Prescriptions  Medication Sig Dispense Refill  . albuterol (PROAIR HFA) 108 (90 BASE) MCG/ACT inhaler Inhale 1-2 puffs into the lungs every 4 (four) hours as needed for wheezing or shortness of breath. 1 Inhaler 3  . albuterol (PROVENTIL) (2.5 MG/3ML) 0.083% nebulizer solution Inhale into the lungs.    Marland Kitchen allopurinol (ZYLOPRIM) 100 MG tablet Take by mouth.    Marland Kitchen ammonium lactate (LAC-HYDRIN) 12 % cream LAC-HYDRIN, 12% (External Cream) - Historical Medication  (12 %) Active    . amphetamine-dextroamphetamine (ADDERALL) 20 MG tablet Take 1 tablet (20 mg total) by mouth 2 (two) times daily. 60 tablet 0  . clindamycin (CLINDAGEL) 1 % gel CLINDAMYCIN PHOSPHATE, 1% (External Gel) - Historical Medication  (1 %) Active    . estradiol (ESTRACE) 0.5 MG tablet   0  . furosemide (LASIX) 20 MG tablet   0  . gabapentin (NEURONTIN) 300  MG capsule Take 600 mg by mouth at bedtime.  0  . imipramine (TOFRANIL-PM) 75 MG capsule Take 1 capsule (75 mg total) by mouth at bedtime. 30 capsule 3  . levothyroxine (SYNTHROID, LEVOTHROID) 25 MCG tablet Take by mouth.    . losartan (COZAAR) 50 MG tablet Take by mouth.    . metoprolol succinate (TOPROL-XL) 50 MG 24 hr tablet Take 1 tablet (50 mg total) by mouth daily. 90 tablet 3  . minocycline (MINOCIN,DYNACIN) 100 MG capsule Take 1 capsule (100 mg total) by mouth daily. 90 capsule 1  . modafinil (PROVIGIL) 200 MG tablet Take 1 tablet (200 mg total) by mouth daily. 30 tablet 5  . Pramipexole Dihydrochloride 1.5 MG TB24 Take 1 mg by mouth.    . SYMBICORT 160-4.5 MCG/ACT inhaler   0  . traMADol (ULTRAM) 50 MG tablet Take by mouth.    . tretinoin (RETIN-A) 0.05 % cream     . amphetamine-dextroamphetamine (ADDERALL XR) 20 MG 24 hr capsule Take 1 capsule (20 mg total) by mouth daily. (Patient not taking: Reported on 09/02/2014) 30 capsule 0  . montelukast (SINGULAIR) 10 MG tablet Take 10 mg by mouth.     No current facility-administered medications for this visit.    Allergies as of 09/02/2014 - Review Complete 09/02/2014  Allergen Reaction Noted  . Bee venom Anaphylaxis 07/08/2014  . Desvenlafaxine Rash and Shortness Of Breath 07/08/2014  . Diltiazem hcl Anaphylaxis 07/08/2014  . Neomycin Anaphylaxis 07/08/2014  . Other Anaphylaxis 07/08/2014  . Pneumococcal polysaccharide vaccine Dermatitis, Rash, and Swelling 07/08/2014  . Prednisone Anaphylaxis 07/08/2014  . Latex Rash 07/08/2014  . Ampicillin Hives 07/08/2014  . Erythromycin Hives 07/08/2014  . Influenza vaccines  07/08/2014  .  Lamotrigine Hives 07/08/2014  . Penicillins Nausea And Vomiting 07/08/2014  . Pneumococcal vaccine Hives 07/08/2014  . Tetracycline Nausea And Vomiting 07/08/2014  . Buspirone Rash 07/08/2014  . Cortisone Rash 07/08/2014  . Imipramine Rash 07/08/2014  . Statins Rash 07/08/2014  . Tape Rash 07/08/2014    . Ziprasidone hcl Rash 07/08/2014    Vitals: BP 126/78 mmHg  Pulse 76  Resp 20  Ht 5\' 4"  (1.626 m)  Wt 168 lb (76.204 kg)  BMI 28.82 kg/m2 Last Weight:  Wt Readings from Last 1 Encounters:  09/02/14 168 lb (76.204 kg)       Last Height:   Ht Readings from Last 1 Encounters:  09/02/14 5\' 4"  (1.626 m)    Physical exam:  General: The patient is awake, alert and appears not in acute distress. The patient is well groomed. Head: Normocephalic, atraumatic. Neck is supple. Mallampati 3 , she has upper and lower dentures. ,  neck circumference: 15 . Nasal airflow unrestricted , TMJ is evident . Retrognathia is not seen. No goiter .  Cardiovascular:  Regular rate and rhythm, without  murmurs or carotid bruit, and without distended neck veins. Respiratory: Lungs are clear to auscultation. Skin:  Without evidence of edema, or rash Trunk: BMI is elevated and patient  has normal posture.  Neurologic exam : The patient is awake and alert, oriented to place and time.   Memory subjective  described as intact. There is a normal attention span & concentration ability. Speech is fluent without  dysarthria, dysphonia or aphasia.  Mood and affect are appropriate.  Cranial nerves:  no change in taste or smell. Pupils are equal and briskly reactive to light. Visual fields by finger perimetry are intact. Hearing to finger rub intact.  Facial motor strength is symmetric and tongue and uvula move midline.  Motor exam: Normal tone,muscle bulk and symmetric ,strength in all extremities.  Sensory:  Fine touch, pinprick and vibration were  normal.  Coordination: Rapid alternating movements in the fingers/hands is normal.  Finger-to-nose maneuver normal without evidence of ataxia, dysmetria or tremor.  Gait and station: Patient walks without assistive device and is able unassisted to climb up to the exam table.  Strength within normal limits. Stance is stable and normal. Deep tendon reflexes: in the   upper and lower extremities are symmetric and intact.    Assessment:  After physical and neurologic examination, review of laboratory studies, imaging, neurophysiology testing and pre-existing records, assessment is   Mrs. Vinsant describes that she has suffered from obstructive sleep apnea for about 6 years. She is even noticed to snore when she wears her CPAP.  The current CPAP pressure may be sufficient to reduce her AHI and she was recommended to use 10 cm water pressure-  but her current machine does not have wifi downloadable abilities. she has not had regular downloaded data through DME.  Both studies that I could compare were CPAP re-titration's , no baseline study- and an M SLT. The M SLT is very suspicious for narcolepsy and her clinical symptoms for cataplexy as well. She has not responded well to Adderall and I think she needs to be placed on modafinil and perhaps even Xyrem to achieve better control.  Modafinil will only address the sleepiness and fatigue component but it will not address cataplexy. She reported muscle cramping after taking modafinil.   The patient also advised me that she is allergic to imipramine. She developed a rash and a tightness of the throat when she  took it the last time.   The patient was advised of the nature of the diagnosed sleep disorder , the treatment options and risks for general a health and wellness arising from not treating the condition. Visit duration was 35  minutes.  Over 50% of our face-to-face time today is designated to the confirmation of the diagnosis of follow-up on her chief complaint of hypersomnia and high fatigue and to some degree assisting her managing her benefits through health team [employer]. She has been told that she is not entitled to a new machine also versus broken "". She still has hypersomnia , being treated for CPAP,  but we don't have compliance data.  Plan:  Treatment plan and additional workup :  #1 the patient has  documented sleep apnea but I still don't know her baseline values. Her current CPAP machine is not working well,  she actually has broken parts.  This is a REM STAR machine from 2012-13.   I would like for her to receive a new machine if possible.  I will now order a new SPLIT night study to finally get data.  In addition her DME company was [facility] and is now [facility] ,  and I will write for new supplies for her CPAP . I would like for her to be fitted with a nasal pillow or nasal mask to see if this can further reduce her apnea index. I also instructed her not to sleep in supine,  but to resume a sleep position on the side - this is difficult with a FFM. Marland Kitchen    2) Given the M SLT results I do think that there is little doubt about her diagnosis of narcolepsy.  What I will try today is to write for modafinil in form of Provigil or Nuvigil  (prefer generic form 200 mg once a day)  and Tofranil in p.m. only. She did not want to try tofranil , citing an allergic a reaction to Imipramine. Gust Brooms is now approved by insurance. Starter dose prescribed today . XYREM EDUCATION given, 30 minute.  3) In addition Mrs. Porath carries a diagnosis of restless leg syndrome. She has been well treated with pramipexole / Mirapex, she has been on higher doses in the past currently than she is only taking (1 mg at night) down from 1.5 MG before. I am concerned that the restless leg component is actually a primary movement disorderand may be related to either subtherapeutic CPAP use .   4) caries a diagnosis of ADD . May benefit from stimulants. This can be addressed with psychiatry .    Rv in 2 month after strating XYREM> SPLIT night ordered.   Asencion Partridge Cherina Dhillon MD  09/02/2014

## 2014-09-02 NOTE — Patient Instructions (Signed)
Narcolepsy Narcolepsy is a nervous system disorder that causes daytime sleepiness and sudden bouts of irresistible sleep during the day (sleep attacks). Many people with narcolepsy live with extreme daytime sleepiness for many years before being diagnosed and treated. Narcolepsy is a lifelong (chronic) disorder.  You normally go through cycles when you sleep. When your sleep becomes deeper, you have less body movement, and you start dreaming. This type of deep sleep should happen after about 90 minutes of lighter sleep. Deep sleep is called rapid eye movement (REM) sleep. When you have narcolepsy, REM is not well regulated. It can happen as soon as you fall asleep, and components of REM sleep can occur during the day when you are awake. Uncontrolled REM sleep causes symptoms of narcolepsy. CAUSES Narcolepsy may be caused by an abnormality with a chemical messenger (neurotransmitter) in your brain that controls your sleep and wake cycles. Most people with narcolepsy have low levels of the neurotransmitter hypocretin. Hypocretin is important for controlling wakefulness.  A hypocretin imbalance may be caused by abnormal genes that are passed down through families. It may also develop if the body's defense system (immune system) mistakenly attacks the brain cells that produce hypocretin (autoimmune disease). RISK FACTORS You may be at higher risk for narcolepsy if you have a family history of the disease. Other risk factors that may contribute include:  Injuries, tumors, or infections in the areas of the brain that control sleep.  Exposure to toxins.  Stress.  Hormones produced during puberty and menopause.  Poor sleep habits. SIGNS AND SYMPTOMS  Symptoms of narcolepsy can start at any age but usually begin when people are between the ages of 7 and 25 years. There are four major symptoms. Not everyone with narcolepsy will have all four.   Excessive daytime sleepiness. This is the most common symptom  and is usually the first symptom you will notice.  You may feel as if you are in a mental fog.  Daytime sleepiness may severely affect your performance at work or school.  You may fall asleep during a conversation or while eating dinner.  Sudden loss of muscle tone (cataplexy). You do not lose consciousness, but you may suddenly lose muscle control. When this occurs, your speech may become slurred, or your knees may buckle. This symptom is usually triggered by surprise, anger, or laughter.  Sleep paralysis. You may lose the ability to speak or move just as you start to fall asleep or wake up. You will be aware of the paralysis. It usually lasts for just a few seconds or minutes.  Vivid hallucinations. These may occur with sleep paralysis. The hallucinations are like having bizarre or frightening dreams while you are still awake. Other symptoms may include:   Trouble staying asleep at night (insomnia).  Restless sleep.  Feeling a strong urge to get up at night to smoke or eat. DIAGNOSIS  Your health care provider can diagnose narcolepsy based on your symptoms and the results of two diagnostic tests. You may also be asked to keep a sleep diary for several weeks. You may need the tests if you have had daytime sleepiness for at least 3 months. The two tests are:   A polysomnogram to find out how well your REM sleep is regulated at night. This test is an overnight sleep study. It measures your heart rate, breathing, movement, and brain waves.  A multiple sleep latency test (MSLT) to find out how well your REM sleep is regulated during the day. This   is a daytime sleep study. You may need to take several naps during the day. This test also measures your heart rate, breathing, movement, and brain waves. TREATMENT  There is no cure for narcolepsy, but treatment can be very effective in helping manage the condition. Treatment may include:  Lifestyle and sleeping strategies to help cope with the  condition.  Medicines. These may include:  Stimulant medicines to fight daytime sleepiness.  Antidepressant medicines to treat cataplexy.  Sodium oxybate. This is a strong sedative that you take at night. It can help daytime sleepiness and cataplexy. HOME CARE INSTRUCTIONS  Take all medicines as directed by your health care provider.  Follow these sleep practices:  Try to get about 8 hours of sleep every night.  Go to sleep and get up close to the same time every day.  Keep your bedroom dark, quiet, and comfortable.  Schedule short naps for when you feel sleepiest during the day. Tell your employer or teachers that you have narcolepsy. You may be able to adjust your schedule to include time for naps.  Try to get at least 20 minutes of exercise every day. This will help you sleep better at night and reduce daytime sleepiness.  Do not drink alcohol or caffeinated beverages within 4-5 hours of bedtime.  Do not eat a heavy meal before bedtime. Eat at about the same times every day.  Do not smoke.  Do not drive if you are sleepy or have untreated narcolepsy.  Do not swim or go out on the water without a life jacket. SEEK MEDICAL CARE IF: Your medicines are not controlling your narcolepsy symptoms. SEEK IMMEDIATE MEDICAL CARE IF:  You hurt yourself during a sleep attack or an attack of cataplexy.  You have chest pain or trouble breathing. Document Released: 12/15/2001 Document Revised: 05/11/2013 Document Reviewed: 12/17/2012 [facility] Patient Information 2015 [place], [place]. This information is not intended to replace advice given to you by your health care provider. Make sure you discuss any questions you have with your health care provider. Sodium Oxybate oral solution What is this medicine? SODIUM OXYBATE (SOE dee um OX i bate) is used to treat excessive sleepiness and cataplexy in patients with narcolepsy. Cataplexy causes a sudden muscle weakness due to a strong emotional  response. This medicine is not available in retail pharmacies. Your doctor will enroll you in a program that will provide the drug to you. This medicine may be used for other purposes; ask your health care provider or pharmacist if you have questions. COMMON BRAND NAME(S): Xyrem What should I tell my health care provider before I take this medicine? They need to know if you have any of these conditions: -depression, psychosis, or other mood disorders -heart disease or high blood pressure -if you frequently drink alcohol containing beverages -if you have a history of drug or alcohol abuse -liver disease -lung disease or difficulty breathing -seizures -succinic semialdehyde dehydrogenase deficiency -thoughts of suicide -an unusual or allergic reaction to sodium oxybate, other medicines, foods, dyes, or preservatives -pregnant or trying to get pregnant -breast-feeding How should I use this medicine? Take this medicine by mouth. Follow the directions on the prescription label. Take this medicine on an empty stomach, at least 30 minutes before or 2 hours after food. Do not take with food. Do not take your medicine more often than directed. A special MedGuide will be given to you by the pharmacist with each prescription and refill. Be sure to read this information carefully each  time. Talk to your pediatrician regarding the use of this medicine in children. Special care may be needed. Overdosage: If you think you have taken too much of this medicine contact a poison control center or emergency room at once. NOTE: This medicine is only for you. Do not share this medicine with others. What if I miss a dose? Skip the missed dose. If it is almost time for your next dose, take only that dose. Allow at least two and one-half hours between each nightly dose. Do not take double or extra doses. What may interact with this medicine? Do not take this medicine with any of the following  medications: -alcohol -barbiturates, like phenobarbital -medicines commonly used for anxiety, sedation or insomnia This medicine may also interact with the following medications: -bupropion -divalproex sodium -dronabinol or marijuana -medicines for psychosis or severe mood disturbances -muscle relaxants -other stimulants, although these are commonly used with sodium oxybate -prescription pain medicines, including tramadol -valproate or valproic acid This list may not describe all possible interactions. Give your health care provider a list of all the medicines, herbs, non-prescription drugs, or dietary supplements you use. Also tell them if you smoke, drink alcohol, or use illegal drugs. Some items may interact with your medicine. What should I watch for while using this medicine? The use of this medicine requires careful supervision. Visit your doctor or health care professional for regular checks on your progress. Do not suddenly stop taking this medicine if you have been taking it for a long time. Withdrawal symptoms may occur. Your doctor or health care professional may need to slowly stop your doses. This medicine may affect your concentration or function. Let your doctor or health care professional know if you have increased sleepiness or confusion during the day. This medicine causes sleep very quickly. You should only take this drug at bedtime, while in bed. Do not drive a car, operate heavy machinery or perform any activities that require mental alertness for at least 6 hours after taking this drug. Use extreme care in any such daily activities until you know how this medicine affects you. Because alcohol may interfere with this medicine and may cause serious side effects, you must avoid alcohol-containing beverages while on this medicine. Do not take this medicine along with sleep medicines or other drugs with strong sedative effects, serious side effects may occur. This medicine can be  dangerous in overdose. If you take more than prescribed or take it by accident, get emergency medical help right away. What side effects may I notice from receiving this medicine? Side effects that you should report to your doctor or health care professional as soon as possible: -allergic reactions like skin rash, itching or hives, swelling of the face, lips, or tongue -breathing problems -confusion -fast, irregular heartbeat -increased blood pressure, particularly if you already have high blood pressure -memory loss -seizures -sleepwalking -tremors or shaking movements -urinary incontinence Side effects that usually do not require medical attention (report to your doctor or health care professional if they continue or are bothersome): -dizziness -drowsiness -headache -increased urination -nausea, vomiting or stomach upset -unusual dreams This list may not describe all possible side effects. Call your doctor for medical advice about side effects. You may report side effects to FDA at 1-800-FDA-1088. Where should I keep my medicine? Keep out of reach of children. This medicine can be abused. Keep your medicine in a safe place to protect it from theft. Do not share this medicine with anyone. Selling or giving  away this medicine is dangerous and against the law. Store at room temperature between 15 and 30 degrees C (59 and 86 degrees F). This medicine may cause accidental overdose and death if it is taken by other adults, children, or pets. Flush any unused medicine down the toilet to reduce the chance of harm. Do not use the medicine after the expiration date. NOTE: This sheet is a summary. It may not cover all possible information. If you have questions about this medicine, talk to your doctor, pharmacist, or health care provider.  2015, [employer]. (2012-07-23 15:45:13)

## 2014-09-02 NOTE — Telephone Encounter (Signed)
Patient called, was just in our office. Dr Dohmeier wanted to do labs for narcolepsy. Patient needs order for labwork.

## 2014-09-02 NOTE — Telephone Encounter (Signed)
Spoke to Dr. Brett Fairy. She says that no labs are needed, she thought about doing an HLA but with the sleep test ordered, the HLA test is not needed at this time. Also, pt's insurance would not cover the HLA test anyways. I called pt to relay this information. Pt verbalized understanding.

## 2014-09-06 ENCOUNTER — Encounter: Payer: Self-pay | Admitting: Neurology

## 2014-09-06 NOTE — Telephone Encounter (Signed)
I spoke with Jamie Murillo at [facility] who verified they have everything needed for the patient.  They are still processing her enrollment at this time.  I have relayed this message to the patient via [place].

## 2014-09-07 ENCOUNTER — Ambulatory Visit (INDEPENDENT_AMBULATORY_CARE_PROVIDER_SITE_OTHER): Payer: PPO | Admitting: Psychiatry

## 2014-09-07 ENCOUNTER — Encounter: Payer: Self-pay | Admitting: Psychiatry

## 2014-09-07 VITALS — BP 110/62 | HR 78 | Temp 97.7°F | Ht 62.5 in | Wt 169.2 lb

## 2014-09-07 DIAGNOSIS — F331 Major depressive disorder, recurrent, moderate: Secondary | ICD-10-CM

## 2014-09-07 NOTE — Progress Notes (Signed)
[facility] MD/NP Note   Patient Identification: Jamie Murillo MRN:  [id number] Date of Evaluation:  09/07/2014 Chief Complaint:   Chief Complaint    Other; Follow-up     Visit Diagnosis:    ICD-9-CM ICD-10-CM   1. MDD (major depressive disorder), recurrent episode, moderate 296.32 F33.1    Diagnosis:   Patient Active Problem List   Diagnosis Date Noted  . Hypersomnia with sleep apnea [G47.10, G47.30] 07/21/2014  . 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 Restless legs syndrome (RLS) [G25.81] 07/21/2014  . OSA on CPAP [G47.33] 07/21/2014  . Chronic diastolic heart failure [J49.70] 06/16/2014  . Arteriosclerosis of coronary artery [I25.10] 06/16/2014  . Gastro-esophageal reflux disease without esophagitis [K21.9] 06/16/2014  . H/O hepatic disease [Z87.19] 06/16/2014  . Arthralgia of multiple joints [M25.50] 06/16/2014  . 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 Angiopathy, peripheral [I73.9] 06/16/2014  . FO (foramen ovale) [Q21.1] 06/16/2014  . Restless leg [G25.81] 06/16/2014  . Paroxysmal digital cyanosis [I73.00] 06/16/2014  . Hyperparathyroidism due to renal insufficiency [N25.81] 06/16/2014  . Drug intolerance [Z88.9] 06/16/2014  . 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 DDD (degenerative disc disease), lumbar [M51.36] 06/03/2014  . Chronic kidney disease (CKD), stage III (moderate) [N18.3] 04/12/2014  . Degenerative arthritis of spine [M47.9] 04/12/2014  . Acne erythematosa [L71.9] 04/12/2014  . Adaptive colitis [K59.8] 07/17/2013  . ADD (attention deficit disorder) [F90.9] 10/07/2012  . 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 Fibrositis [M79.7] 05/07/2012  . Pelvic muscle wasting [N94.9] 05/07/2012  . Hemorrhoid [K64.9] 01/18/2012  . Absolute anemia [D64.9] 11/27/2011  . Gout [M10.9] 11/27/2011  . HLD (hyperlipidemia) [E78.5] 11/27/2011  . Adult hypothyroidism [E03.9] 11/27/2011  . Female genuine stress  incontinence [N39.3] 06/15/2011  . Enterocele [K46.9] 06/15/2011  . Acquired hammer toe [M20.40] 05/24/2011  . Bunion [M20.10] 10/31/2010  . Aortic heart valve narrowing [I35.0] 10/02/2010  . Airway hyperreactivity [J45.909] 10/02/2010  . 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 BP (high blood pressure) [I10] 10/02/2010  . Obstructive apnea [G47.33] 10/02/2010  . Episode of syncope [R55] 10/02/2010  . 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 ASD (atrial septal defect), ostium secundum [Q21.1] 06/29/2006  . Diabetes [E11.9] 06/29/2006   History of Present Illness:    Patient is a 64 year old married neatly groomed female who presented for follow up.  She was following up with her diagnosis of narcolepsy and reported that she is currently awaiting on her prescription medications Xyrem. Patient stated that she feels that she has gained a lot of weight and is concerned about the same. She feels that she might binge eating. Patient reported that she has been having discussions with the pharmacist about the use of Xyrem and she is willing to start the medication to control her narcolepsy symptoms including sleep attacks, hypnagogic and hypnopompic  hallucinations and feeling tired during the daytime. Patient reported that she will be willing to try medication  for her depressive symptoms once she is stabilized on the Xyrem . She currently denied having any suicidal homicidal ideations or plans . She appeared calm and cooperative during the interview     Past Medical History:  Past Medical History  Diagnosis Date  . History of chronic hepatitis     Hepatitis B and C  . Raynaud's disease without gangrene   . Gout   . Secondary hyperparathyroidism, renal   . Postural orthostatic tachycardia syndrome   . Coronary artery disease with history of myocardial infarction without history of CABG   .  Surgical menopause on hormone replacement therapy   . Restless leg syndrome,  controlled   . CHF NYHA class I     chronic, diastolic  . 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 GERD without esophagitis   . Hypothyroidism, adult   . Chronic renal impairment, stage 3 (moderate)   . Obstructive sleep apnea, adult     CPAP  . Statin intolerance   . Rosacea   . Patent foramen ovale   . Chronic asthma   . Adult attention deficit disorder   . Bipolar disorder   . Depression   . Hepatitis B   . Hepatitis C   . Hypertension   . Diabetes mellitus without complication   . Fibromyalgia     Past Surgical History  Procedure Laterality Date  . Foot surgery Bilateral   . Breast surgery    . Pelvic floor repair    . Abdominal hysterectomy    . Bilateral tubal ligation    . Bladder surgery      "repair"  . Bunionectomy Bilateral    Family History:  Family History  Problem Relation Age of Onset  . Hypertension Mother   . Hypothyroidism Mother   . Heart disease Mother   . Lung cancer Mother   . Alcohol abuse Mother   . Depression Mother   . Hypertension Father   . Heart disease Father   . Alcohol abuse Father   . Hypothyroidism Sister   . Hypertension Sister   . Bipolar disorder Sister   . Heart disease Brother   . Alcohol abuse Brother   . Bipolar disorder Brother   . Hypothyroidism Sister   . Hypertension Sister    Social History:   Social History   Social History  . Marital Status: Married    Spouse Name: Philippa Chester  . Number of Children: 3  . Years of Education: 22   Occupational History  . unemployed    Social History Main Topics  . Smoking status: Never Smoker   . Smokeless tobacco: Never Used     Comment: tried smoking in youth  . Alcohol Use: No     Comment: used when she was 64 years old.   . Drug Use: Yes    Special: LSD, Marijuana     Comment: 07/21/14 "tried in youth"  . Sexual Activity: Yes    Birth Control/ Protection: None   Other Topics  Concern  . None   Social History Narrative   Married, lives at home with husband   Caffeine use- 1 soda, 1 tea daily   Additional Social History:   Patient is currently married and lives with her husband. 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    Musculoskeletal: Strength & Muscle Tone: within normal limits Gait & Station: normal Patient leans: N/A  Psychiatric Specialty Exam: HPI   Review of Systems  Constitutional: Positive for malaise/fatigue. Negative for weight loss.  Musculoskeletal: Positive for myalgias and back pain.  Psychiatric/Behavioral: Positive for depression. The patient is nervous/anxious and has insomnia.   All other systems reviewed and are negative.   Blood pressure 110/62, pulse 78, temperature 97.7 F (36.5 C), temperature source Tympanic, height 5' 2.5" (1.588 m), weight 169 lb 3.2 oz (76.749 kg), SpO2 97 %.Body mass index is 30.43 kg/(m^2).  General Appearance: Well Groomed  Eye Contact:  Fair  Speech:  Normal Rate  Volume:  Normal  Mood:  Euthymic  Affect:  Congruent  Thought Process:  Coherent  Orientation:  Full (Time, Place, and Person)  Thought Content:  WDL  Suicidal Thoughts:  No  Homicidal Thoughts:  No  Memory:  Immediate;   Fair   Judgement:  Fair  Insight:  Fair  Psychomotor Activity:  Normal  Concentration:  Fair  Recall:  [employer] of Knowledge:Fair  Language: Fair  Akathisia:  No  Handed:  Right  AIMS (if indicated):  none  Assets:  Communication Skills Desire for Improvement Social Support  ADL's:  Intact  Cognition: WNL  Sleep:  6-7   Is the patient at risk to self?  No. Has the patient been a risk to self in the past 6 months?  No. Has the patient been a risk to self within the distant past?  Yes.   Is the patient a risk to others?  No. Has the patient been a risk to others in the past 6 months?  No. Has the patient been a risk to others within the distant past?   No.  Allergies:   Allergies  Allergen Reactions  . Bee Venom Anaphylaxis  . Desvenlafaxine Rash and Shortness Of Breath  . Diltiazem Hcl Anaphylaxis  . Neomycin Anaphylaxis  . Other Anaphylaxis    pistacchios  . Pneumococcal Polysaccharide Vaccine Dermatitis, Rash and Swelling  . Prednisone Anaphylaxis  . Latex Rash  . Ampicillin Hives  . Erythromycin Hives  . Influenza Vaccines     Patient has a history of anaphylaxis to neomycin and other agents of that class.    . Lamotrigine Hives  . Penicillins Nausea And Vomiting  . Pneumococcal Vaccine Hives  . Tetracycline Nausea And Vomiting  . Buspirone Rash  . Cortisone Rash  . Imipramine Rash  . Statins Rash    Statin induced myopathy  . Tape Rash    BUSPAR. BUSPAR SEPTRA. SEPTRA  . Ziprasidone Hcl Rash   Current Medications: Current Outpatient Prescriptions  Medication Sig Dispense Refill  . albuterol (PROAIR HFA) 108 (90 BASE) MCG/ACT inhaler Inhale 1-2 puffs into the lungs every 4 (four) hours as needed for wheezing or shortness of breath. 1 Inhaler 3  . albuterol (PROVENTIL) (2.5 MG/3ML) 0.083% nebulizer solution Inhale into the lungs.    Marland Kitchen allopurinol (ZYLOPRIM) 100 MG tablet Take by mouth.    Marland Kitchen ammonium lactate (LAC-HYDRIN) 12 % cream LAC-HYDRIN, 12% (External Cream) - Historical Medication  (12 %) Active    . clindamycin (CLINDAGEL) 1 % gel CLINDAMYCIN PHOSPHATE, 1% (External Gel) - Historical Medication  (1 %) Active    . estradiol (ESTRACE) 0.5 MG tablet   0  . furosemide (LASIX) 20 MG tablet   0  . gabapentin (NEURONTIN) 300 MG capsule Take 600 mg by mouth at bedtime.  0  . levothyroxine (SYNTHROID, LEVOTHROID) 25 MCG tablet Take by mouth.    . losartan (COZAAR) 50 MG tablet Take by mouth.    . metoprolol succinate (TOPROL-XL) 50 MG 24 hr tablet Take 1 tablet (50 mg total) by mouth daily. 90 tablet 3  . minocycline (MINOCIN,DYNACIN) 100 MG capsule Take 1 capsule (100 mg total) by mouth daily. 90 capsule 1  .  Pramipexole Dihydrochloride 1.5 MG TB24 Take 1 mg by mouth.    . SYMBICORT 160-4.5 MCG/ACT inhaler   0  . tretinoin (RETIN-A) 0.05 % cream     . amphetamine-dextroamphetamine (ADDERALL XR) 20 MG 24 hr capsule Take 1 capsule (20 mg  total) by mouth daily. (Patient not taking: Reported on 09/07/2014) 30 capsule 0  . amphetamine-dextroamphetamine (ADDERALL) 20 MG tablet Take 1 tablet (20 mg total) by mouth 2 (two) times daily. (Patient not taking: Reported on 09/07/2014) 60 tablet 0  . imipramine (TOFRANIL-PM) 75 MG capsule Take 1 capsule (75 mg total) by mouth at bedtime. (Patient not taking: Reported on 09/07/2014) 30 capsule 3  . modafinil (PROVIGIL) 200 MG tablet Take 1 tablet (200 mg total) by mouth daily. (Patient not taking: Reported on 09/07/2014) 30 tablet 5  . montelukast (SINGULAIR) 10 MG tablet Take 10 mg by mouth.    . Sodium Oxybate (XYREM) 500 MG/ML SOLN Take 5.5 mLs (2,750 mg total) by mouth 2 (two) times daily. (Patient not taking: Reported on 09/07/2014) 1 Bottle 1   No current facility-administered medications for this visit.    Previous Psychotropic Medications: Yes   Substance Abuse History in the last 12 months:  No.  Consequences of Substance Abuse: NA  Medical Decision Making:  Established Problem, Stable/Improving (1) and Decision to obtain old records (1)  Treatment Plan Summary: Medication management   Patient reported that she is currently waiting on her prescription medication Xyrem for her narcolepsy and does not want to try and the psycho topic medications at this time. She will be willing to try something for her depression and anxiety at the next appointment in a month or so. Will follow-up in one month or earlier    More than 50% of the time spent in psychoeducation, counseling and coordination of care.    This note was generated in part or whole with voice recognition software. Voice regonition is usually quite accurate but there are transcription errors  that can and very often do occur. I apologize for any typographical errors that were not detected and corrected.    Rainey Pines, MD

## 2014-09-08 ENCOUNTER — Encounter: Payer: Self-pay | Admitting: Neurology

## 2014-09-08 ENCOUNTER — Telehealth: Payer: Self-pay

## 2014-09-08 NOTE — Telephone Encounter (Signed)
[facility] has confirmed the patient is enrolled with the current [facility] REMS Program.   

## 2014-09-08 NOTE — Telephone Encounter (Signed)
Fax has been returned to [facility] at [phone].  They just wanted a copy of the patient's diagnosis.  [facility]ens CVC # [id number]. Cyril Mourning spoke with Dr Brett Fairy, who prefers to only have Xyrem dispensed for 30 day supply until she is an established patient on the medication. Responded to patient via MyChart.

## 2014-09-09 ENCOUNTER — Encounter: Payer: Self-pay | Admitting: Neurology

## 2014-09-09 ENCOUNTER — Telehealth: Payer: Self-pay | Admitting: Neurology

## 2014-09-09 NOTE — Telephone Encounter (Signed)
I called back.  Spoke with Ellie.  She said they just wanted to verify the Rx came from our office.  I verified it had.  She will note file, and says nothing further is needed at this time.

## 2014-09-09 NOTE — Telephone Encounter (Signed)
[facility] is calling about medication Sodium Oxybate (XYREM) 500 MG/ML SOLN.  [phone] Option 3, then 4

## 2014-09-10 ENCOUNTER — Encounter: Payer: Self-pay | Admitting: Neurology

## 2014-09-14 ENCOUNTER — Other Ambulatory Visit: Payer: Self-pay | Admitting: Family Medicine

## 2014-09-14 ENCOUNTER — Encounter: Payer: Self-pay | Admitting: Neurology

## 2014-09-14 ENCOUNTER — Telehealth: Payer: Self-pay | Admitting: Neurology

## 2014-09-14 DIAGNOSIS — G47411 Narcolepsy with cataplexy: Secondary | ICD-10-CM

## 2014-09-14 DIAGNOSIS — N183 Chronic kidney disease, stage 3 (moderate): Secondary | ICD-10-CM

## 2014-09-14 DIAGNOSIS — Z5181 Encounter for therapeutic drug level monitoring: Secondary | ICD-10-CM

## 2014-09-14 DIAGNOSIS — I5022 Chronic systolic (congestive) heart failure: Secondary | ICD-10-CM

## 2014-09-14 NOTE — Telephone Encounter (Signed)
[facility] [phone] called re Sodium Oxybate (XYREM) 500 MG/ML SOLN, patient reported history of stage 3 CKD, stage 1 heart failure, high blood pressure.

## 2014-09-14 NOTE — Telephone Encounter (Signed)
[facility] phone number [phone] option 3, option 4 to get directly to a Pharmacist

## 2014-09-16 ENCOUNTER — Telehealth: Payer: Self-pay | Admitting: *Deleted

## 2014-09-16 NOTE — Telephone Encounter (Signed)
Left voice mail with Jamie Murillo, the patient, and thank you for forwarding her last sleep study results finally to [place]. This will streamline our diagnostic procedure. I also left a voicemail telling her that the pharmacy is concerned about the sodium intake in the setting of kidney disease and that I will write her on my chart or keep her posted with a phone message after I spoke to the pharmacist.  I have in the meanwhile spoken with the pharmacist at [facility] were voiced the concern about the chronic renal disease stage III and history of congestive heart failure stage I. We will draw a basic metabolic panel every 14 days with the patient for the first 3 months of treatment and see if she can tolerate Xyrem or not. If it is for physiologic metabolic or psychologic reasons not a comfortable drug for her I will have to send her to a tertiary care center.    Send an FYI to Dr. Doy Hutching with the hope that the BMET can be drawn through PCP locally.  CD

## 2014-09-16 NOTE — Telephone Encounter (Signed)
Received sleep study results pt emailed to [place].  Printed and given to Dr. Brett Fairy for review.

## 2014-09-16 NOTE — Telephone Encounter (Signed)
Cat with [facility] called about medication Xyrem. They are concerned that pt may be receiving to much sodium. Please call them back at [phone] option 3, option 4. Also note previous tele notes .

## 2014-09-16 NOTE — Addendum Note (Signed)
Addended by: Larey Seat on: 09/16/2014 05:58 PM   Modules accepted: Orders

## 2014-09-17 ENCOUNTER — Encounter: Payer: Self-pay | Admitting: Neurology

## 2014-09-17 ENCOUNTER — Telehealth: Payer: Self-pay | Admitting: Neurology

## 2014-09-17 NOTE — Telephone Encounter (Signed)
Called patient to schedule her split sleep study and the patient thinks she is having a MSLT.  I read the chart notes and I see the Split order but the patient say she sent in a prior sleep study report and wants to know if she still has to have this split sleep test

## 2014-09-20 NOTE — Telephone Encounter (Signed)
I spoke to pt. Dr. Brett Fairy still wants her to come in for a split night study to establish a baseline sleep study. Pt is agreeable to this. She already has had an MSLT done this year and does not want this repeated.  Pt is also agreeable to having BMP drawn at her PCP every 14 days for 3 months after starting xyrem. Order from Dr. Brett Fairy already in. Pt wishes to use Dr. Doy Hutching.

## 2014-09-24 ENCOUNTER — Telehealth: Payer: Self-pay

## 2014-09-24 NOTE — Telephone Encounter (Signed)
Ruben Im Nurse Case Manager from [facility] stated she spoke with the patient today, and the patient determined she no longer wishes to take Xyrem.  The patient decided to discontinue this med on 09/14.  If there are any questions, she may be reached at [phone] ext [phone]

## 2014-09-28 NOTE — Telephone Encounter (Signed)
No questions. D?C xyrem per patients wishes. No follow up needed. CD

## 2014-10-12 ENCOUNTER — Ambulatory Visit (INDEPENDENT_AMBULATORY_CARE_PROVIDER_SITE_OTHER): Payer: PPO | Admitting: Psychiatry

## 2014-10-12 ENCOUNTER — Encounter: Payer: Self-pay | Admitting: Psychiatry

## 2014-10-12 VITALS — BP 122/68 | HR 86 | Temp 97.8°F | Ht 62.0 in | Wt 167.2 lb

## 2014-10-12 DIAGNOSIS — I779 Disorder of arteries and arterioles, unspecified: Secondary | ICD-10-CM | POA: Insufficient documentation

## 2014-10-12 DIAGNOSIS — F329 Major depressive disorder, single episode, unspecified: Secondary | ICD-10-CM | POA: Insufficient documentation

## 2014-10-12 DIAGNOSIS — F331 Major depressive disorder, recurrent, moderate: Secondary | ICD-10-CM | POA: Diagnosis not present

## 2014-10-12 NOTE — Progress Notes (Signed)
[facility]iatric [facility] MD/NP Note   Patient Identification: Dorri Ozturk MRN:  [id number] Date of Evaluation:  10/12/2014 Chief Complaint:   Chief Complaint    Follow-up; Medication Refill     Visit Diagnosis:    ICD-9-CM ICD-10-CM   1. MDD (major depressive disorder), recurrent episode, moderate (HCC) 296.32 F33.1    Diagnosis:   Patient Active Problem List   Diagnosis Date Noted  . Major depressive disorder ([place]) [F32.9] 10/12/2014  . Peripheral arterial occlusive disease ([place]) [I77.9] 10/12/2014  . Hypersomnia with sleep apnea [G47.10, G47.30] 07/21/2014  . 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 Restless legs syndrome (RLS) [G25.81] 07/21/2014  . OSA on CPAP [G47.33] 07/21/2014  . Chronic diastolic heart failure ([place]) [I50.32] 06/16/2014  . Arteriosclerosis of coronary artery [I25.10] 06/16/2014  . Gastro-esophageal reflux disease without esophagitis [K21.9] 06/16/2014  . H/O hepatic disease [Z87.19] 06/16/2014  . Arthralgia of multiple joints [M25.50] 06/16/2014  . 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 Angiopathy, peripheral ([place]) [I73.9] 06/16/2014  . FO (foramen ovale) [Q21.1] 06/16/2014  . Restless leg [G25.81] 06/16/2014  . Paroxysmal digital cyanosis [I73.00] 06/16/2014  . Hyperparathyroidism due to renal insufficiency ([place]) [N25.81] 06/16/2014  . Drug intolerance [Z88.9] 06/16/2014  . 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 DDD (degenerative disc disease), lumbar [M51.36] 06/03/2014  . Chronic kidney disease (CKD), stage III (moderate) [N18.3] 04/12/2014  . Degenerative arthritis of spine [M47.9] 04/12/2014  . Acne erythematosa [L71.9] 04/12/2014  . Other specified health status [Z78.9] 04/12/2014  . Adaptive colitis [K59.8] 07/17/2013  . ADD (attention deficit disorder) [F90.9] 10/07/2012  . 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 Fibrositis [M79.7] 05/07/2012  . Pelvic muscle wasting [N81.84] 05/07/2012  .  Hemorrhoid [K64.9] 01/18/2012  . Absolute anemia [D64.9] 11/27/2011  . Gout [M10.9] 11/27/2011  . HLD (hyperlipidemia) [E78.5] 11/27/2011  . Adult hypothyroidism [E03.9] 11/27/2011  . Chronic obstructive pulmonary disease ([place]) [J44.9] 11/27/2011  . Female genuine stress incontinence [N39.3] 06/15/2011  . Enterocele [K46.9] 06/15/2011  . Acquired hammer toe [M20.40] 05/24/2011  . Bunion [M21.619] 10/31/2010  . Aortic heart valve narrowing [I35.0] 10/02/2010  . Airway hyperreactivity [J45.909] 10/02/2010  . 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 BP (high blood pressure) [I10] 10/02/2010  . Obstructive apnea [G47.33] 10/02/2010  . Episode of syncope [R55] 10/02/2010  . 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 Myocardial infarction ([place]) [I21.3] 10/02/2010  . Type 2 diabetes mellitus ([place]) [E11.9] 10/02/2010  . ASD (atrial septal defect), ostium secundum [Q21.1] 06/29/2006  . Diabetes ([place]) [E11.9] 06/29/2006   Subjective:  Patient is a 64 year old married neatly groomed female who presented for follow up.  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 She reported that she stopped taking the medication after that. She reported it was also very expensive and she [facility] help [facility]. Patient reported that she is concerned about her weight gain and was looking for some medication to help her lose weight. She is allergic to all that her depression medications and does not want to try any of them. She remained focus on the wait gain and is not doing any exercise or weight loss strategies. Patient reported that she stays at home most of the time. She is also cleaning her house for the fall whether and is trying to take out her  winter clothes. She appeared calm and collected during the  interview. She currently denied having any mood symptom,  depression and anxiety or paranoia.She appeared calm and cooperative during the interview     Past Medical History:  Past Medical History  Diagnosis Date  . History of chronic hepatitis     Hepatitis B and C  . Raynaud's disease without gangrene   . Gout   . Secondary hyperparathyroidism, renal ([place])   . Postural orthostatic tachycardia syndrome   . Coronary artery disease with history of myocardial infarction without history of CABG   . Surgical menopause on hormone replacement therapy   . Restless leg syndrome, controlled   . CHF NYHA class I (HCC)     chronic, diastolic  . 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 GERD without esophagitis   . Hypothyroidism, adult   . Chronic renal impairment, stage 3 (moderate)   . Obstructive sleep apnea, adult     CPAP  . Statin intolerance   . Rosacea   . Patent foramen ovale   . Chronic asthma   . Adult attention deficit disorder   . Bipolar disorder ([place])   . Depression   . Hepatitis B   . Hepatitis C   . Hypertension   . Diabetes mellitus without complication ([place])   . Fibromyalgia     Past Surgical History  Procedure Laterality Date  . Foot surgery Bilateral   . Breast surgery    . Pelvic floor repair    . Abdominal hysterectomy    . Bilateral tubal ligation    . Bladder surgery      "repair"  . Bunionectomy Bilateral    Family History:  Family History  Problem Relation Age of Onset  . Hypertension Mother   . Hypothyroidism Mother   . Heart disease Mother   . Lung cancer Mother   . Alcohol abuse Mother   . Depression Mother   . Hypertension Father   . Heart disease Father   . Alcohol abuse Father   . Hypothyroidism Sister   . Hypertension Sister   . Bipolar disorder Sister   . Heart disease Brother   . Alcohol abuse Brother   . Bipolar disorder Brother   .  Hypothyroidism Sister   . Hypertension Sister    Social History:   Social History   Social History  . Marital Status: Married    Spouse Name: Philippa Chester  . Number of Children: 3  . Years of Education: 22   Occupational History  . unemployed    Social History Main Topics  . Smoking status: Never Smoker   . Smokeless tobacco: Never Used     Comment: tried smoking in youth  . Alcohol Use: No     Comment: used when she was 64 years old.   . Drug Use: Yes    Special: LSD, Marijuana     Comment: 07/21/14 "tried in youth"  . Sexual Activity: Yes    Birth Control/ Protection: None   Other Topics Concern  . None   Social History Narrative   Married, lives at home with husband   Caffeine use- 1 soda, 1 tea daily   Additional Social History:   Patient is currently married and lives with her husband. 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    Musculoskeletal: Strength & Muscle Tone: within normal limits Gait &  Station: normal Patient leans: N/A  Psychiatric Specialty Exam: HPI   Review of Systems  Constitutional: Positive for malaise/fatigue. Negative for weight loss.  Musculoskeletal: Positive for myalgias and back pain.  Psychiatric/Behavioral: Positive for depression. The patient is nervous/anxious and has insomnia.   All other systems reviewed and are negative.   Blood pressure 122/68, pulse 86, temperature 97.8 F (36.6 C), temperature source Tympanic, height 5\' 2"  (1.575 m), weight 167 lb 3.2 oz (75.841 kg), SpO2 97 %.Body mass index is 30.57 kg/(m^2).  General Appearance: Well Groomed  Eye Contact:  Fair  Speech:  Normal Rate  Volume:  Normal  Mood:  Euthymic  Affect:  Congruent  Thought Process:  Coherent  Orientation:  Full (Time, Place, and Person)  Thought Content:  WDL  Suicidal Thoughts:  No  Homicidal Thoughts:  No  Memory:  Immediate;   Fair   Judgement:  Fair  Insight:  Fair  Psychomotor Activity:  Normal   Concentration:  Fair  Recall:  [employer] of Knowledge:Fair  Language: Fair  Akathisia:  No  Handed:  Right  AIMS (if indicated):  none  Assets:  Communication Skills Desire for Improvement Social Support  ADL's:  Intact  Cognition: WNL  Sleep:  6-7   Is the patient at risk to self?  No. Has the patient been a risk to self in the past 6 months?  No. Has the patient been a risk to self within the distant past?  Yes.   Is the patient a risk to others?  No. Has the patient been a risk to others in the past 6 months?  No. Has the patient been a risk to others within the distant past?  No.  Allergies:   Allergies  Allergen Reactions  . Bee Venom Anaphylaxis  . Desvenlafaxine Rash, Shortness Of Breath and Anaphylaxis  . Diltiazem Hcl Anaphylaxis  . Neomycin Anaphylaxis  . Other Anaphylaxis    pistacchios  . Pneumococcal Polysaccharide Vaccine Dermatitis, Rash and Swelling  . Prednisone Anaphylaxis  . Latex Rash  . Ampicillin Hives  . Erythromycin Hives  . Influenza Vaccines     Patient has a history of anaphylaxis to neomycin and other agents of that class.    . Lamotrigine Hives  . Penicillins Nausea And Vomiting  . Pneumococcal Vaccine Hives  . Tetracycline Nausea And Vomiting  . Buspirone Rash  . Cortisone Rash  . Imipramine Rash  . Statins Rash    Statin induced myopathy  . Tape Rash    BUSPAR. BUSPAR SEPTRA. SEPTRA  . Ziprasidone Hcl Rash   Current Medications: Current Outpatient Prescriptions  Medication Sig Dispense Refill  . albuterol (PROAIR HFA) 108 (90 BASE) MCG/ACT inhaler Inhale 1-2 puffs into the lungs every 4 (four) hours as needed for wheezing or shortness of breath. 1 Inhaler 3  . albuterol (PROVENTIL) (2.5 MG/3ML) 0.083% nebulizer solution Inhale into the lungs.    Marland Kitchen allopurinol (ZYLOPRIM) 100 MG tablet Take by mouth.    Marland Kitchen ammonium lactate (LAC-HYDRIN) 12 % cream LAC-HYDRIN, 12% (External Cream) - Historical Medication  (12 %) Active    .  ammonium lactate (LAC-HYDRIN) 12 % lotion APPLY TO DAMP SKIN DAILY  1  . clindamycin (CLINDAGEL) 1 % gel CLINDAMYCIN PHOSPHATE, 1% (External Gel) - Historical Medication  (1 %) Active    . Clobetasol Propionate 0.05 % shampoo   0  . estradiol (ESTRACE) 0.5 MG tablet   0  . fluticasone (FLONASE) 50 MCG/ACT nasal spray instill 2  sprays into each nostril NIGHTLY  0  . furosemide (LASIX) 40 MG tablet Take 40 mg by mouth daily.  0  . gabapentin (NEURONTIN) 300 MG capsule Take 600 mg by mouth at bedtime.  0  . imipramine (TOFRANIL-PM) 75 MG capsule Take 1 capsule (75 mg total) by mouth at bedtime. 30 capsule 3  . levothyroxine (SYNTHROID, LEVOTHROID) 25 MCG tablet Take by mouth.    . losartan (COZAAR) 50 MG tablet Take by mouth.    . metoprolol succinate (TOPROL-XL) 50 MG 24 hr tablet Take 1 tablet (50 mg total) by mouth daily. 90 tablet 3  . minocycline (MINOCIN,DYNACIN) 100 MG capsule Take 1 capsule (100 mg total) by mouth daily. 90 capsule 1  . montelukast (SINGULAIR) 10 MG tablet   0  . pramipexole (MIRAPEX) 1 MG tablet   0  . Pramipexole Dihydrochloride 1.5 MG TB24 Take 1 mg by mouth.    . SYMBICORT 160-4.5 MCG/ACT inhaler   0  . tretinoin (RETIN-A) 0.05 % cream     . montelukast (SINGULAIR) 10 MG tablet Take 10 mg by mouth.     No current facility-administered medications for this visit.    Previous Psychotropic Medications: Yes   Substance Abuse History in the last 12 months:  No.  Consequences of Substance Abuse: NA  Medical Decision Making:  Established Problem, Stable/Improving (1) and Decision to obtain old records (1)  Treatment Plan Summary: Medication management   Depression Patient is not interested in taking any medication at this time. Discussed with her at length about the different medications but she declined. She will start therapy in 1 month or depending on her symptoms. Follow-up in 3 months or as needed    More than 50% of the time spent in psychoeducation,  counseling and coordination of care.  Time spent with the patient 25 minutes   This note was generated in part or whole with voice recognition software. Voice regonition is usually quite accurate but there are transcription errors that can and very often do occur. I apologize for any typographical errors that were not detected and corrected.    Rainey Pines, MD

## 2014-10-17 ENCOUNTER — Ambulatory Visit (INDEPENDENT_AMBULATORY_CARE_PROVIDER_SITE_OTHER): Payer: PPO | Admitting: Neurology

## 2014-10-17 DIAGNOSIS — G4733 Obstructive sleep apnea (adult) (pediatric): Secondary | ICD-10-CM

## 2014-10-17 DIAGNOSIS — R519 Headache, unspecified: Secondary | ICD-10-CM

## 2014-10-17 DIAGNOSIS — G47411 Narcolepsy with cataplexy: Secondary | ICD-10-CM

## 2014-10-17 DIAGNOSIS — Z9989 Dependence on other enabling machines and devices: Secondary | ICD-10-CM

## 2014-10-17 DIAGNOSIS — G2581 Restless legs syndrome: Secondary | ICD-10-CM

## 2014-10-17 DIAGNOSIS — R51 Headache: Secondary | ICD-10-CM

## 2014-10-17 NOTE — Sleep Study (Signed)
Please see the scanned sleep study interpretation located in the Procedure tab within the Chart Review section. 

## 2014-10-21 ENCOUNTER — Encounter: Payer: Self-pay | Admitting: Neurology

## 2014-10-21 ENCOUNTER — Telehealth: Payer: Self-pay

## 2014-10-21 DIAGNOSIS — G4733 Obstructive sleep apnea (adult) (pediatric): Secondary | ICD-10-CM

## 2014-10-21 NOTE — Telephone Encounter (Signed)
Spoke to pt and advised her that her sleep study revealed that sleep apnea is still present and cpap treatment is advised. Dr. Brett Fairy recommends proceeding with auto titration study to optimize therapy. Pt is willing to proceed with auto titration study. I advised pt that she may want to think about weight loss and positional therapy. I advised pt to avoid sedative hypnotics, alcohol, and tobacco, and to avoid driving or operating hazardous machinery when sleepy. Pt verbalized understanding.   Pt wishes me to send the order to [facility] for her 2 week auto titration.

## 2014-10-27 ENCOUNTER — Other Ambulatory Visit: Payer: Self-pay | Admitting: Internal Medicine

## 2014-10-27 DIAGNOSIS — Z1231 Encounter for screening mammogram for malignant neoplasm of breast: Secondary | ICD-10-CM

## 2014-11-01 ENCOUNTER — Telehealth: Payer: Self-pay

## 2014-11-01 ENCOUNTER — Other Ambulatory Visit: Payer: Self-pay

## 2014-11-01 DIAGNOSIS — G4733 Obstructive sleep apnea (adult) (pediatric): Secondary | ICD-10-CM

## 2014-11-01 NOTE — Telephone Encounter (Signed)
I spoke to pt to ask if [facility] had done the 2 week auto titration study yet. They have not. I asked pt if she still wanted to see Dr. Brett Fairy on 10/26. She still wants to keep this appt because she wants to discuss medications.  I called [facility]. They have not done the auto titration study. Their documentation shows that the pt refused cpap supplies and that is why they didn't set her up. I asked if [facility] would be able to supply the pt a new cpap in the future. Meredith at [facility] is checking into this and will call me back.

## 2014-11-01 NOTE — Progress Notes (Signed)
Spoke to [place] at [facility]. Ailene Ravel advised me that [facility] would be able to supply the pt a new cpap, but they recommended starting an auto cpap 4-20 cm H2O so the pt could get a new machine and after a 2 week auto titration, we could just start it at the pressure recommended by Dr. Brett Fairy after she reviews the results.  I advised Dr. Brett Fairy of this and she is agreeable, but only wants the pressures to be between 5-15 cm H2O.

## 2014-11-03 ENCOUNTER — Encounter: Payer: Self-pay | Admitting: Neurology

## 2014-11-03 ENCOUNTER — Ambulatory Visit (INDEPENDENT_AMBULATORY_CARE_PROVIDER_SITE_OTHER): Payer: PPO | Admitting: Neurology

## 2014-11-03 VITALS — BP 122/68 | HR 78 | Resp 20 | Ht 62.0 in | Wt 172.0 lb

## 2014-11-03 DIAGNOSIS — G47411 Narcolepsy with cataplexy: Secondary | ICD-10-CM

## 2014-11-03 DIAGNOSIS — G4733 Obstructive sleep apnea (adult) (pediatric): Secondary | ICD-10-CM | POA: Diagnosis not present

## 2014-11-03 DIAGNOSIS — F909 Attention-deficit hyperactivity disorder, unspecified type: Secondary | ICD-10-CM

## 2014-11-03 MED ORDER — METHYLPHENIDATE HCL 10 MG PO TABS: 10.0000 mg | ORAL_TABLET | Freq: Two times a day (BID) | ORAL | Status: DC

## 2014-11-03 NOTE — Progress Notes (Signed)
SLEEP MEDICINE CLINIC   Provider:  Larey Seat, M D  Referring Provider: Idelle Crouch, MD Primary Care Physician:  Idelle Crouch, MD  Chief Complaint  Patient presents with  . Follow-up    sleep study results, [facility] said they could get her a new machine (they told me they could give her an auto pap in a call on Tuesday), wants to discuss other stimulants, not taking modafiil or xyrem, wants to try adderall, rm 11, alone    HPI:  Stewart Pimenta is a 64 y.o. female , seen here as a referral from Dr. Doy Hutching .  Mrs. Messinger has used CPAP since 2011 the studies I have available here are from 2016 generally and show that she is excessively hypersomnia or excessively daytime sleepy by being sufficiently treated on CPAP. The CPAP interpretation showed a sleep time of 376.5 minutes , a sleep latency of 15 minutes, a sleep efficiency of only 81.1% , REM proportion was 28.3% and the AHI was only 5.4 per hour,  the RDI was 6.4 per hour.  During REM sleep AHI  was increased to 10.1 and in supine position 5.4 AHI.  The patient had borderline bradycardia , with an average heart rate was 68. BPM  however. There were no periodic limb movements . She was titrated to 9 cm water pressure she also was tried on 10 cm water pressure and the technologist noted that she seemed to do better.  It was recommended to use 10 cm water pressure CPAP as the most optimal pressure for her. Patient then was required to stay the next day on 01-20-14 for an MS LT interestingly she was given 5 nap opportunities sleep onsets were supposedly within less than a minute in each of her periods and she had REM onset supposedly and 4 out of 5 naps. 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 Patient was treated with CPAP prior to the MS LT , no need to re-titrate her.  Patient goes to bed between 10 and 11 PM, adheres to sleep hygiene rules. She  sleeeps supine, because of the CPAP . She was never tried on a nasal pillow.  She sleeps in a cool, quiet, and dark room, uses CPAP every night/  Sometimes she will find her self sleep walking, eating, . She will get up about 4.30 to help her husband to get ready, sh is retired.  She struggles all day with hypersomnia. She drinks 2 caffeineated sodas. Ice tea. No ETOH , no tobacco use.  She does not take scheduled naps, and had insomnia before. The current degree of sleepiness begun 3- years ago. In the last 18 month she gained 35 pounds!   Interval history from 09-02-14- I still have no access to a baseline sleep study for this patient, and I'm given data from as early as 2016 reflecting a CPAP titration only but no baseline study. Given the difficulties we have to control the patient's excessive daytime sleepiness, she still endorses the Epworth Sleepiness Scale at 22 points and the fatigue severity scale at 61 points, it would have been helpful to at least obtain a download. These data are not available from her machine either. She is using currently a full face mask, AIR fit F , 10 by [employer]. Given her reported history of imipramine allergy she was not able to take Tofranil she stated. So I have no cataplexy controlling medication  for this patient. She carries a diagnosis of ADD and the use of stimulants can treat both excessive daytime sleepiness and ADD, but it is not an optimal treatment option for a lady of 64 years of age. Especially with heart disease  Blood pressure, obesity etc.  Interval history from 10-20 6-16. Mrs. Davee underwent a sleep study a baseline polysomnography on 10-17-14.   My concern was that this patient carries a diagnosis of narcolepsy and always a had been re-titrated to a CPAP machine but I had no access to at baseline diagnostic sleep study. For this reason and for her endorsed Epworth Sleepiness Scale at 23 out of 24 points the sleep study was ordered. The patient's  sleep latency was 34 minutes REM latency was 62.5 minutes sleep efficiency was 88.4 her AHI was 11.8 which constitutes mild sleep apnea. Nadir of oxygen was 75% but was only 21 minutes total of desaturation time. She did not retain CO2 above 50 torr, the study revealed that obstructive sleep apnea was still present and given the patient's very high degree of sleepiness should be treated. I advised to proceed with an hour told titrate her. It was only yesterday October 25 that we [facility] and [facility] will provide an auto titrate a setting from 5-15 cm pressure window. She can use any interface to her comfort. She has been using full face masks only in  the past. She would like to continue.   Mrs. Chamblee doesn't drive and needs an [facility] branch in [place].  Her second sleep disorder narcolepsy had been diagnosed into daytime studies. The medication usually used to treat narcolepsy and is related sleepiness bears however several risks for this patient. She has a history of heart disease and she is in stage III chronic kidney disease. We discussed today the use of a stimulant such as Ritalin or Adderall which will make it easier for her to stay awake in daytime. I am not optimistic that it will reduce her Epworth sleepiness score below 10 but it may work temporarily and give her a predictable time window to operate machinery or car. She endorsed today the Epworth score at 22 points and the fatigue severity score at 55 points. The geriatric depression score was endorsed at 2 points. My plan is to have Mrs. Arcos use the outflow CPAP and see her after 30 days of using the machine. We will then reobtain an Epworth sleepiness score. In the meantime I will order a very low dose of Ritalin for her, to allow her more wakefulness in daytime. Based on her renal disease and heart condition it is not advisable to treat this patient with Xyrem due to the high sodium load.       Review of  Systems: Mrs. Delcid is here today endorsing the Epworth sleepiness score 22/ 24 and endorsed the fatigue severity score at 55 points. The patient has voluntarily stopped to drive.   Out of a complete 14 system review, the patient complains of only the following symptoms, and all other reviewed systems are negative. Snoring when not using CPAP.  She reports very vivid dreams  (sometimes very real appearing dreams ) . These  wake her up and fragment her sleep.  She experienced sleep paralysis more than once, hypnagogic and hypnopompic hallucinations. Dream intrusions. Screaming in her dreams.  Acting out dreams. When she is in emotionally taxing or upsetting situation , she noticed her knees to buckle.   The  patient (anecdotal but she saw her little daughter touching the hot stove top with her hand and in her emotional shock she was not able to go to her daughter )first she screamed out for her husband to get her. So this would be a cataplectic example.  When she was still driving she would chew gum , pinch herself, and keep the car's windows open and the radio on full blast -  all to no avail as she still would fall asleep.   Social History   Social History  . Marital Status: Married    Spouse Name: Philippa Chester  . Number of Children: 3  . Years of Education: 22   Occupational History  . unemployed    Social History Main Topics  . Smoking status: Never Smoker   . Smokeless tobacco: Never Used     Comment: tried smoking in youth  . Alcohol Use: No     Comment: used when she was 64 years old.   . Drug Use: Yes    Special: LSD, Marijuana     Comment: 07/21/14 "tried in youth"  . Sexual Activity: Yes    Birth Control/ Protection: None   Other Topics Concern  . Not on file   Social History Narrative   Married, lives at home with husband   Caffeine use- 1 soda, 1 tea daily    Current Outpatient Prescriptions  Medication Sig Dispense Refill  . albuterol (PROAIR HFA) 108 (90 BASE)  MCG/ACT inhaler Inhale 1-2 puffs into the lungs every 4 (four) hours as needed for wheezing or shortness of breath. 1 Inhaler 3  . albuterol (PROVENTIL) (2.5 MG/3ML) 0.083% nebulizer solution Inhale into the lungs.    Marland Kitchen allopurinol (ZYLOPRIM) 100 MG tablet Take 200 mg by mouth.     Marland Kitchen ammonium lactate (LAC-HYDRIN) 12 % cream LAC-HYDRIN, 12% (External Cream) - Historical Medication  (12 %) Active    . ammonium lactate (LAC-HYDRIN) 12 % lotion APPLY TO DAMP SKIN DAILY  1  . clindamycin (CLINDAGEL) 1 % gel CLINDAMYCIN PHOSPHATE, 1% (External Gel) - Historical Medication  (1 %) Active    . Clobetasol Propionate 0.05 % shampoo   0  . estradiol (ESTRACE) 0.5 MG tablet   0  . fluticasone (FLONASE) 50 MCG/ACT nasal spray instill 2 sprays into each nostril NIGHTLY  0  . furosemide (LASIX) 40 MG tablet Take 40 mg by mouth daily.  0  . gabapentin (NEURONTIN) 300 MG capsule Take 600 mg by mouth at bedtime.  0  . levothyroxine (SYNTHROID, LEVOTHROID) 25 MCG tablet Take by mouth.    . losartan (COZAAR) 50 MG tablet Take by mouth.    . metoprolol succinate (TOPROL-XL) 50 MG 24 hr tablet Take 1 tablet (50 mg total) by mouth daily. 90 tablet 3  . minocycline (MINOCIN,DYNACIN) 100 MG capsule Take 1 capsule (100 mg total) by mouth daily. 90 capsule 1  . montelukast (SINGULAIR) 10 MG tablet   0  . pramipexole (MIRAPEX) 1 MG tablet   0  . SYMBICORT 160-4.5 MCG/ACT inhaler   0  . tretinoin (RETIN-A) 0.05 % cream     . montelukast (SINGULAIR) 10 MG tablet Take 10 mg by mouth.     No current facility-administered medications for this visit.    Allergies as of 11/03/2014 - Review Complete 11/03/2014  Allergen Reaction Noted  . Bee venom Anaphylaxis 07/08/2014  . Desvenlafaxine Rash, Shortness Of Breath, and Anaphylaxis 07/08/2014  . Diltiazem hcl Anaphylaxis 07/08/2014  . Neomycin Anaphylaxis  07/08/2014  . Other Anaphylaxis 07/08/2014  . Pneumococcal polysaccharide vaccine Dermatitis, Rash, and Swelling  07/08/2014  . Prednisone Anaphylaxis 07/08/2014  . Latex Rash 07/08/2014  . Ampicillin Hives 07/08/2014  . Erythromycin Hives 07/08/2014  . Influenza vaccines  07/08/2014  . Lamotrigine Hives 07/08/2014  . Penicillins Nausea And Vomiting 07/08/2014  . Pneumococcal vaccine Hives 07/08/2014  . Tetracycline Nausea And Vomiting 07/08/2014  . Buspirone Rash 07/08/2014  . Cortisone Rash 07/08/2014  . Imipramine Rash 07/08/2014  . Statins Rash 07/08/2014  . Tape Rash 07/08/2014  . Ziprasidone hcl Rash 07/08/2014    Vitals: BP 122/68 mmHg  Pulse 78  Resp 20  Ht 5\' 2"  (1.575 m)  Wt 172 lb (78.019 kg)  BMI 31.45 kg/m2 Last Weight:  Wt Readings from Last 1 Encounters:  11/03/14 172 lb (78.019 kg)       Last Height:   Ht Readings from Last 1 Encounters:  11/03/14 5\' 2"  (1.575 m)    Physical exam:  General: The patient is awake, alert and appears not in acute distress. The patient is well groomed. Head: Normocephalic, atraumatic. Neck is supple. Mallampati 3 , she has upper and lower dentures.  neck circumference: 15. Nasal airflow unrestricted, TMJ is evident. Retrognathia is not seen. No goiter .  Cardiovascular:  Regular rate and rhythm, without  murmurs or carotid bruit, and without distended neck veins. Respiratory: Lungs are clear to auscultation. Skin:  Without evidence of edema, or rash Trunk: BMI is elevated and patient  has normal posture.  Neurologic exam : The patient is awake and alert, oriented to place and time.   Memory subjective described as intact.  There is a normal attention span & concentration ability.  Speech is fluent without  dysarthria, dysphonia or aphasia. Mood and affect are appropriate.  Cranial nerves:  no change in taste or smell. Pupils are equal and briskly reactive to light. Visual fields by finger perimetry are intact. Hearing to finger rub intact.  Facial motor strength is symmetric and tongue and uvula move midline.  Motor exam: Normal  tone,muscle bulk and symmetric ,strength in all extremities.  Sensory:  Fine touch, pinprick and vibration were  normal.  Coordination: Rapid alternating movements in the fingers/hands is normal.  Finger-to-nose maneuver normal without evidence of ataxia, dysmetria or tremor.  Gait and station: Patient walks without assistive device and is able unassisted to climb up to the exam table.  Strength within normal limits. Stance is stable and normal.  Deep tendon reflexes: in the  upper and lower extremities are symmetric and intact.    Assessment:  After physical and neurologic examination, review of laboratory studies, imaging, neurophysiology testing and pre-existing records, assessment is   She reported muscle cramping after taking modafinil.  The patient also advised me that she is allergic to imipramine. She developed a rash and a tightness of the throat when she took it the last time.   The patient was advised of the nature of the diagnosed sleep disorder , the treatment options and risks for general a health and wellness arising from not treating the condition. Visit duration was 15  minutes.  Over 50% of our face-to-face time today is designated to the confirmation of the diagnosis of follow-up on her chief complaint of hypersomnia and high fatigue and to some degree assisting her managing her benefits through health team advantage. She has been told that she is not entitled to a new machine also versus broken".    She still  has hypersomnia, being treated for OSA from tomorrow on auto -CPAP, [facility] 5-15 cm water.  I will ask  FOR COMPLIANCNE DATA in 30 days and 90 days.  I have prescribed Ritalin at a low dose for daytime use.    Plan:  Treatment plan and additional workup :  1) the patient has documented sleep apnea , AHI was 11.4 , she was ordered a auto CPAP 5-15 with a mask of her choice. I also instructed her not to sleep in supine,  but to resume a sleep position on the side -  this is difficult with a FFM. Marland Kitchen    2)Given the MSLT results I do think that there is little doubt about her diagnosis of narcolepsy. I will prescribe Ritalin, since    3) In addition Mrs. Stoltzfus carries a diagnosis of restless leg syndrome. She has been well treated with pramipexole / Mirapex, she has been on higher doses in the past currently than she is only taking (1 mg at night) down from 1.5 MG before. I am concerned that the restless leg component is actually a primary movement disorderand may be related to either subtherapeutic CPAP use .   4) caries a diagnosis of ADD/ AHD . May benefit from stimulants. This can be addressed with psychiatry-.    Rv in 2 month with NP , Ritalin prescribed through the sleep clinic. I asked the patient to see a psychiatrist, now sees a counselor.    Asencion Partridge Khizar Fiorella MD  11/03/2014

## 2014-11-03 NOTE — Patient Instructions (Signed)
Methylphenidate tablets What is this medicine? METHYLPHENIDATE (meth il FEN i date) is used to treat attention-deficit hyperactivity disorder (ADHD). It is also used to treat narcolepsy. This medicine may be used for other purposes; ask your health care provider or pharmacist if you have questions. What should I tell my health care provider before I take this medicine? They need to know if you have any of these conditions: -anxiety or panic attacks -circulation problems in fingers and toes -glaucoma -hardening or blockages of the arteries or heart blood vessels -heart disease or a heart defect -high blood pressure -history of a drug or alcohol abuse problem -history of stroke -liver disease -mental illness -motor tics, family history or diagnosis of Tourette's syndrome -seizures -suicidal thoughts, plans, or attempt; a previous suicide attempt by you or a family member -thyroid disease -an unusual or allergic reaction to methylphenidate, other medicines, foods, dyes, or preservatives -pregnant or trying to get pregnant -breast-feeding How should I use this medicine? Take this medicine by mouth with a glass of water. Follow the directions on the prescription label. It is best to take this medicine 30 to 45 minutes before meals, unless your doctor tells you otherwise. Take your medicine at regular intervals. Usually the last dose of the day will be taken at least 4 to 6 hours before bedtime, so it will not interfere with sleep. Do not take your medicine more often than directed. A special MedGuide will be given to you by the pharmacist with each prescription and refill. Be sure to read this information carefully each time. Talk to your pediatrician regarding the use of this medicine in children. While this drug may be prescribed for children as young as 50 years of age for selected conditions, precautions do apply. Overdosage: If you think you have taken too much of this medicine contact a  poison control center or emergency room at once. NOTE: This medicine is only for you. Do not share this medicine with others. What if I miss a dose? If you miss a dose, take it as soon as you can. If it is almost time for your next dose, take only that dose. Do not take double or extra doses. What may interact with this medicine? Do not take this medicine with any of the following medications: -lithium -MAOIs like Carbex, Eldepryl, Marplan, Nardil, and Parnate -other stimulant medicines for attention disorders, weight loss, or to stay awake -procarbazine This medicine may also interact with the following medications: -atomoxetine -caffeine -certain medicines for blood pressure, heart disease, irregular heart beat -certain medicines for depression, anxiety, or psychotic disturbances -certain medicines for seizures like carbamazepine, phenobarbital, phenytoin -cold or allergy medicines -warfarin This list may not describe all possible interactions. Give your health care provider a list of all the medicines, herbs, non-prescription drugs, or dietary supplements you use. Also tell them if you smoke, drink alcohol, or use illegal drugs. Some items may interact with your medicine. What should I watch for while using this medicine? Visit your doctor or health care professional for regular checks on your progress. This prescription requires that you follow special procedures with your doctor and pharmacy. You will need to have a new written prescription from your doctor or health care professional every time you need a refill. This medicine may affect your concentration, or hide signs of tiredness. Until you know how this drug affects you, do not drive, ride a bicycle, use machinery, or do anything that needs mental alertness. Tell your doctor or health  care professional if this medicine loses its effects, or if you feel you need to take more than the prescribed amount. Do not change the dosage without  talking to your doctor or health care professional. For males, contact your doctor or health care professional right away if you have an erection that lasts longer than 4 hours or if it becomes painful. This may be a sign of a serious problem and must be treated right away to prevent permanent damage. Decreased appetite is a common side effect when starting this medicine. Eating small, frequent meals or snacks can help. Talk to your doctor if you continue to have poor eating habits. Height and weight growth of a child taking this medicine will be monitored closely. Do not take this medicine close to bedtime. It may prevent you from sleeping. If you are going to need surgery, a MRI, CT scan, or other procedure, tell your doctor that you are taking this medicine. You may need to stop taking this medicine before the procedure. Tell your doctor or healthcare professional right away if you notice unexplained wounds on your fingers and toes while taking this medicine. You should also tell your healthcare provider if you experience numbness or pain, changes in the skin color, or sensitivity to temperature in your fingers or toes. What side effects may I notice from receiving this medicine? Side effects that you should report to your doctor or health care professional as soon as possible: -allergic reactions like skin rash, itching or hives, swelling of the face, lips, or tongue -changes in vision -chest pain or chest tightness -fast, irregular heartbeat -fingers or toes feel numb, cool, painful -hallucination, loss of contact with reality -high blood pressure -males: prolonged or painful erection -seizures -severe headaches -shortness of breath -suicidal thoughts or other mood changes -trouble walking, dizziness, loss of balance or coordination -uncontrollable head, mouth, neck, arm, or leg movements -unusual bleeding or bruising Side effects that usually do not require medical attention (report to  your doctor or health care professional if they continue or are bothersome): -anxious -headache -loss of appetite -nausea, vomiting -trouble sleeping -weight loss This list may not describe all possible side effects. Call your doctor for medical advice about side effects. You may report side effects to FDA at 1-800-FDA-1088. Where should I keep my medicine? Keep out of the reach of children. This medicine can be abused. Keep your medicine in a safe place to protect it from theft. Do not share this medicine with anyone. Selling or giving away this medicine is dangerous and against the law. This medicine may cause accidental overdose and death if taken by other adults, children, or pets. Mix any unused medicine with a substance like cat litter or coffee grounds. Then throw the medicine away in a sealed container like a sealed bag or a coffee can with a lid. Do not use the medicine after the expiration date. Store at room temperature between 15 and 30 degrees C (59 and 86 degrees F). Protect from light and moisture. Keep container tightly closed. NOTE: This sheet is a summary. It may not cover all possible information. If you have questions about this medicine, talk to your doctor, pharmacist, or health care provider.    2016, Elsevier/Gold Standard. (2013-09-15 15:33:34)

## 2014-11-04 ENCOUNTER — Inpatient Hospital Stay
Admission: RE | Admit: 2014-11-04 | Discharge: 2014-11-04 | Disposition: A | Discharge status: Home / Self Care | Payer: Self-pay | Source: Ambulatory Visit | Attending: *Deleted | Admitting: *Deleted

## 2014-11-04 ENCOUNTER — Other Ambulatory Visit: Payer: Self-pay | Admitting: *Deleted

## 2014-11-04 DIAGNOSIS — Z9289 Personal history of other medical treatment: Secondary | ICD-10-CM

## 2014-11-05 ENCOUNTER — Encounter: Payer: Self-pay | Admitting: Neurology

## 2014-11-05 ENCOUNTER — Ambulatory Visit
Admission: RE | Admit: 2014-11-05 | Discharge: 2014-11-05 | Disposition: A | Discharge status: Home / Self Care | Payer: PPO | Source: Ambulatory Visit | Attending: Internal Medicine | Admitting: Internal Medicine

## 2014-11-05 DIAGNOSIS — Z1231 Encounter for screening mammogram for malignant neoplasm of breast: Secondary | ICD-10-CM | POA: Insufficient documentation

## 2014-11-05 HISTORY — DX: Malignant (primary) neoplasm, unspecified: C80.1

## 2014-11-08 ENCOUNTER — Telehealth: Payer: Self-pay

## 2014-11-08 ENCOUNTER — Encounter: Payer: Self-pay | Admitting: Neurology

## 2014-11-08 NOTE — Telephone Encounter (Signed)
[facility] faxed [place] a prior auth request for Methylphenidate Friday at 7:13pm.  Since our office was already closed, and is not open on the weekend, we received the request this morning (Monday).  Ins has been contacted and provided with clinical info.  Request is currently under review Ref # [id number]

## 2014-11-08 NOTE — Telephone Encounter (Signed)
[facility]) has approved the request for coverage on Methylphenidate effective until 01/08/2015.  Pharmacy has been notified of this decision as well.  We have also replied to patient's MyChart message advising of approval.

## 2014-11-09 ENCOUNTER — Telehealth: Payer: Self-pay

## 2014-11-09 NOTE — Telephone Encounter (Signed)
Received this notice from [facility]: "We have spoken with patient and informed her that she not eligible for a replacement machine as she got her current machine in 2012 from [place]. Her insurance requires that the equipment be at least 64 years old and have something wrong with it and go through the repair process, regardless of who paid for the original equipment. We can take her on as a supplies patient but not do the auto titration.   We also had an RT see the patient on 8/3 and notes state:  Met with pt and her machine had been taken apart. i put it back together and it worked fine. The knob is missing but the button still works. I downloaded her card and she has not been compliant, so no supplies. her mask is not that old she said the sleep lab gave it to her.   We are unable to take her as a patient due to non compliance.she will need to stay with [employer].   Side note: She said [employer] is very difficult to work with because she had to file bankruptcy and she owed them money and they are not willing to work with her, even for DL's....   She is in a sticky situation. Maybe she should pay [employer] and then they will help her."  I faxed pt's order to [place]. Pt is aware.

## 2014-11-11 ENCOUNTER — Encounter: Payer: PPO | Attending: Obstetrics and Gynecology | Admitting: Dietician

## 2014-11-11 VITALS — Ht 62.0 in | Wt 167.4 lb

## 2014-11-11 DIAGNOSIS — R635 Abnormal weight gain: Secondary | ICD-10-CM | POA: Insufficient documentation

## 2014-11-11 DIAGNOSIS — N183 Chronic kidney disease, stage 3 unspecified: Secondary | ICD-10-CM

## 2014-11-11 DIAGNOSIS — E119 Type 2 diabetes mellitus without complications: Secondary | ICD-10-CM

## 2014-11-11 DIAGNOSIS — I509 Heart failure, unspecified: Secondary | ICD-10-CM

## 2014-11-11 DIAGNOSIS — E785 Hyperlipidemia, unspecified: Secondary | ICD-10-CM

## 2014-11-11 DIAGNOSIS — I10 Essential (primary) hypertension: Secondary | ICD-10-CM

## 2014-11-11 NOTE — Patient Instructions (Signed)
   Measure food portions, especially starches (2/3 c or less or rice, 1 c or less potatoes or beans) and meats (3oz or palm-size or less).   Increase vegetables and fruits; aim for at least 1 serving each meal but ideally 5 servings daily.   Establish an eating and sleeping schedule; plan to eat every 3-5 hours and allow at least 2-3 hours between meals and snacks.

## 2014-11-11 NOTE — Progress Notes (Signed)
Medical Nutrition Therapy: Visit start time: 8546  end time: 1630  Assessment:  Diagnosis: weight gain Past medical history: CHF, Diabetes (diet controlled), chronic kidney disease (Stage 3 per MD notes), gout, hyperlipidemia, HTN, GERD    Patient also reports likely gluten intolerance, had been following gluten free diet for a while but stopped and now has GI symptoms.   Psychosocial issues/ stress concerns: history of depression, not currently.  Preferred learning method:  Nicki [place] . Hands-on   Current weight: 167.4lbs  Height: 5'2" Medications, supplements: reviewed list in chart Progress and evaluation: Patient reports recent weight gain, along with 8-10lb weight fluctuation within a week due to fluid retention.          She seeks guidance in healthy strategies for weight loss that work with gluten free and low sodium eating patterns.           She states she likes to cook and requests recipes to fit with dietary restrictions.          She is unable to engage in regular exercise due to foot and leg edema and back pain.  Physical activity: none   Dietary Intake:  Usual eating pattern includes 2-3 meals and 2-3 snacks per day. Dining out frequency: 1-2 meals per week.  Breakfast: 6am usually leftovers from dinner the night before Snack: might snack on additional leftover food throughout the day Lunch: sometimes leftover, sometimes soup or sandwich with deli meat, sometimes skips Snack: same as am Supper: chicken or beef (ground meat or roast), potato or other starch, vegetables Snack: often snacks during the night Beverages: water, sugar free drinks  Nutrition Care Education: Topics covered: weight management, gluten free diet, low sodium diet Basic nutrition: basic food groups, appropriate nutrient balance, appropriate meal and snack schedule   Weight control: 1200kcal meal plan guide, eating awareness, structured eating, appropriate food portions Diabetes:  appropriate carb  intake and balance Hypertension/ renal disease:  identifying high sodium foods, identifying food sources of potassium, magnesium Hyperlipidemia:  healthy and unhealthy fats Gluten-free diet:  Gluten free options for starches, low fodmap foods (patient states this is what she was previously following)  Nutritional Diagnosis:  Christiana-3.4 Unintentional weight gain As related to unstructured eating pattern.  As evidenced by patient report.  Intervention: Instruction as noted above.   Set goals with patient input.      Education Materials given:  . Food lists/ Planning A Balanced Meal . Recipes (modified Diabetes-Friendly recipes) . Sample meal pattern/ menus . Fodmap foods to choose/ avoid  Low-sodium diet (AND)  Mrs. Dash recipe ideas . Goals/ instructions  Learner/ who was taught:  . Patient   Level of understanding: Marland Kitchen Verbalizes/ demonstrates competency  Demonstrated degree of understanding via:   Teach back Learning barriers: . None  Willingness to learn/ readiness for change: . Eager, change in progress   Monitoring and Evaluation:  Dietary intake, exercise, fluid retention/ BP control, BG control, and body weight      follow up: 12/09/14

## 2014-11-24 ENCOUNTER — Encounter: Payer: Self-pay | Admitting: Neurology

## 2014-12-06 ENCOUNTER — Ambulatory Visit: Payer: PPO | Admitting: Anesthesiology

## 2014-12-06 ENCOUNTER — Ambulatory Visit
Admission: RE | Admit: 2014-12-06 | Discharge: 2014-12-06 | Disposition: A | Discharge status: Home / Self Care | Payer: PPO | Source: Ambulatory Visit | Attending: Unknown Physician Specialty | Admitting: Unknown Physician Specialty

## 2014-12-06 ENCOUNTER — Encounter
Admission: RE | Disposition: A | Discharge status: Home / Self Care | Payer: Self-pay | Source: Ambulatory Visit | Attending: Unknown Physician Specialty

## 2014-12-06 ENCOUNTER — Encounter: Payer: Self-pay | Admitting: *Deleted

## 2014-12-06 DIAGNOSIS — Z818 Family history of other mental and behavioral disorders: Secondary | ICD-10-CM | POA: Diagnosis not present

## 2014-12-06 DIAGNOSIS — Z90710 Acquired absence of both cervix and uterus: Secondary | ICD-10-CM | POA: Insufficient documentation

## 2014-12-06 DIAGNOSIS — Z888 Allergy status to other drugs, medicaments and biological substances status: Secondary | ICD-10-CM | POA: Diagnosis not present

## 2014-12-06 DIAGNOSIS — I252 Old myocardial infarction: Secondary | ICD-10-CM | POA: Insufficient documentation

## 2014-12-06 DIAGNOSIS — Z881 Allergy status to other antibiotic agents status: Secondary | ICD-10-CM | POA: Diagnosis not present

## 2014-12-06 DIAGNOSIS — Z86010 Personal history of colonic polyps: Secondary | ICD-10-CM | POA: Diagnosis present

## 2014-12-06 DIAGNOSIS — Z9889 Other specified postprocedural states: Secondary | ICD-10-CM | POA: Insufficient documentation

## 2014-12-06 DIAGNOSIS — Z91030 Bee allergy status: Secondary | ICD-10-CM | POA: Diagnosis not present

## 2014-12-06 DIAGNOSIS — M47819 Spondylosis without myelopathy or radiculopathy, site unspecified: Secondary | ICD-10-CM | POA: Diagnosis not present

## 2014-12-06 DIAGNOSIS — I130 Hypertensive heart and chronic kidney disease with heart failure and stage 1 through stage 4 chronic kidney disease, or unspecified chronic kidney disease: Secondary | ICD-10-CM | POA: Diagnosis not present

## 2014-12-06 DIAGNOSIS — I5032 Chronic diastolic (congestive) heart failure: Secondary | ICD-10-CM | POA: Insufficient documentation

## 2014-12-06 DIAGNOSIS — M109 Gout, unspecified: Secondary | ICD-10-CM | POA: Insufficient documentation

## 2014-12-06 DIAGNOSIS — Z8249 Family history of ischemic heart disease and other diseases of the circulatory system: Secondary | ICD-10-CM | POA: Diagnosis not present

## 2014-12-06 DIAGNOSIS — K640 First degree hemorrhoids: Secondary | ICD-10-CM | POA: Insufficient documentation

## 2014-12-06 DIAGNOSIS — F319 Bipolar disorder, unspecified: Secondary | ICD-10-CM | POA: Diagnosis not present

## 2014-12-06 DIAGNOSIS — Z887 Allergy status to serum and vaccine status: Secondary | ICD-10-CM | POA: Diagnosis not present

## 2014-12-06 DIAGNOSIS — Q211 Atrial septal defect: Secondary | ICD-10-CM | POA: Insufficient documentation

## 2014-12-06 DIAGNOSIS — I2510 Atherosclerotic heart disease of native coronary artery without angina pectoris: Secondary | ICD-10-CM | POA: Diagnosis not present

## 2014-12-06 DIAGNOSIS — Z79899 Other long term (current) drug therapy: Secondary | ICD-10-CM | POA: Insufficient documentation

## 2014-12-06 DIAGNOSIS — K219 Gastro-esophageal reflux disease without esophagitis: Secondary | ICD-10-CM | POA: Diagnosis not present

## 2014-12-06 DIAGNOSIS — M797 Fibromyalgia: Secondary | ICD-10-CM | POA: Insufficient documentation

## 2014-12-06 DIAGNOSIS — G2581 Restless legs syndrome: Secondary | ICD-10-CM | POA: Diagnosis not present

## 2014-12-06 DIAGNOSIS — G47419 Narcolepsy without cataplexy: Secondary | ICD-10-CM | POA: Diagnosis not present

## 2014-12-06 DIAGNOSIS — B1920 Unspecified viral hepatitis C without hepatic coma: Secondary | ICD-10-CM | POA: Insufficient documentation

## 2014-12-06 DIAGNOSIS — N183 Chronic kidney disease, stage 3 (moderate): Secondary | ICD-10-CM | POA: Insufficient documentation

## 2014-12-06 DIAGNOSIS — Z85828 Personal history of other malignant neoplasm of skin: Secondary | ICD-10-CM | POA: Diagnosis not present

## 2014-12-06 DIAGNOSIS — Z91040 Latex allergy status: Secondary | ICD-10-CM | POA: Diagnosis not present

## 2014-12-06 DIAGNOSIS — I7300 Raynaud's syndrome without gangrene: Secondary | ICD-10-CM | POA: Diagnosis not present

## 2014-12-06 DIAGNOSIS — E039 Hypothyroidism, unspecified: Secondary | ICD-10-CM | POA: Diagnosis not present

## 2014-12-06 DIAGNOSIS — Z880 Allergy status to penicillin: Secondary | ICD-10-CM | POA: Diagnosis not present

## 2014-12-06 DIAGNOSIS — E1122 Type 2 diabetes mellitus with diabetic chronic kidney disease: Secondary | ICD-10-CM | POA: Diagnosis not present

## 2014-12-06 DIAGNOSIS — Z8349 Family history of other endocrine, nutritional and metabolic diseases: Secondary | ICD-10-CM | POA: Diagnosis not present

## 2014-12-06 DIAGNOSIS — G4733 Obstructive sleep apnea (adult) (pediatric): Secondary | ICD-10-CM | POA: Insufficient documentation

## 2014-12-06 DIAGNOSIS — Z91048 Other nonmedicinal substance allergy status: Secondary | ICD-10-CM | POA: Diagnosis not present

## 2014-12-06 DIAGNOSIS — Z811 Family history of alcohol abuse and dependence: Secondary | ICD-10-CM | POA: Insufficient documentation

## 2014-12-06 DIAGNOSIS — J45909 Unspecified asthma, uncomplicated: Secondary | ICD-10-CM | POA: Diagnosis not present

## 2014-12-06 DIAGNOSIS — N2581 Secondary hyperparathyroidism of renal origin: Secondary | ICD-10-CM | POA: Insufficient documentation

## 2014-12-06 HISTORY — PX: COLONOSCOPY: SHX5424

## 2014-12-06 SURGERY — COLONOSCOPY
Anesthesia: General

## 2014-12-06 MED ORDER — SODIUM CHLORIDE 0.9 % IV SOLN: INTRAVENOUS | Status: DC

## 2014-12-06 MED ORDER — SODIUM CHLORIDE 0.9 % IV SOLN
INTRAVENOUS | Status: DC
  Administered 2014-12-06: 14:00:00 via INTRAVENOUS

## 2014-12-06 MED ORDER — FENTANYL CITRATE (PF) 100 MCG/2ML IJ SOLN
INTRAMUSCULAR | Status: DC | PRN
  Administered 2014-12-06: 50 ug via INTRAVENOUS

## 2014-12-06 MED ORDER — PROPOFOL 10 MG/ML IV BOLUS
INTRAVENOUS | Status: DC | PRN
  Administered 2014-12-06: 20 mg via INTRAVENOUS
  Administered 2014-12-06: 50 mg via INTRAVENOUS
  Administered 2014-12-06: 20 mg via INTRAVENOUS

## 2014-12-06 MED ORDER — PROPOFOL 500 MG/50ML IV EMUL
INTRAVENOUS | Status: DC | PRN
  Administered 2014-12-06: 150 ug/kg/min via INTRAVENOUS

## 2014-12-06 NOTE — Op Note (Signed)
[facility] Patient Name: Jamie Murillo Procedure Date: 12/06/2014 2:29 PM MRN: [id number] Account #: [id number] Date of Birth: November 06, 1950 Admit Type: Outpatient Age: 64 Room: [facility] ENDO ROOM 1 Gender: Female Note Status: Finalized Procedure:         Colonoscopy Indications:       High risk colon cancer surveillance: Personal history of                     colonic polyps Providers:         Manya Silvas, MD Referring MD:      Leonie Douglas. Doy Hutching, MD (Referring MD) Medicines:         Propofol per Anesthesia Complications:     No immediate complications. Procedure:         Pre-Anesthesia Assessment:                    - After reviewing the risks and benefits, the patient was                     deemed in satisfactory condition to undergo the procedure.                    After obtaining informed consent, the colonoscope was                     passed under direct vision. Throughout the procedure, the                     patient's blood pressure, pulse, and oxygen saturations                     were monitored continuously. The Colonoscope was                     introduced through the anus and advanced to the the cecum,                     identified by appendiceal orifice and ileocecal valve. The                     colonoscopy was performed without difficulty. The patient                     tolerated the procedure well. The quality of the bowel                     preparation was good. Findings:      Internal hemorrhoids were found during endoscopy. The hemorrhoids were       small and Grade I (internal hemorrhoids that do not prolapse).      The exam was otherwise without abnormality. Impression:        - Internal hemorrhoids.                    - The examination was otherwise normal.                    - No specimens collected. Recommendation:    - Repeat colonoscopy in 5 years for surveillance. Manya Silvas, MD 12/06/2014 2:53:42  PM This report has been signed electronically. Number of Addenda: 0 Note Initiated On: 12/06/2014 2:29 PM Scope Withdrawal Time: 0 hours 7 minutes 51 seconds  Total Procedure Duration: 0 hours 14 minutes 10 seconds  [facility]

## 2014-12-06 NOTE — Transfer of Care (Signed)
Immediate Anesthesia Transfer of Care Note  Patient: Jamie Murillo  Procedure(s) Performed: Procedure(s): COLONOSCOPY (N/A)  Patient Location: PACU  Anesthesia Type:General  Level of Consciousness: awake, alert  and oriented  Airway & Oxygen Therapy: Patient Spontanous Breathing and Patient connected to nasal cannula oxygen  Post-op Assessment: Report given to RN, Post -op Vital signs reviewed and stable and Patient moving all extremities  Post vital signs: Reviewed and stable  Last Vitals:  Filed Vitals:   12/06/14 1342 12/06/14 1452  BP: 150/50   Pulse: 60   Temp: 36 C 35.9 C  Resp: 18     Complications: No apparent anesthesia complications

## 2014-12-06 NOTE — H&P (Signed)
Primary Care Physician:  Idelle Crouch, MD Primary Gastroenterologist:  Dr. Vira Agar  Pre-Procedure History & Physical: HPI:  Jamie Murillo is a 64 y.o. female is here for an colonoscopy.   Past Medical History  Diagnosis Date  . History of chronic hepatitis     Hepatitis B and C  . Raynaud's disease without gangrene   . Gout   . Secondary hyperparathyroidism, renal ([place])   . Postural orthostatic tachycardia syndrome   . Coronary artery disease with history of myocardial infarction without history of CABG   . Surgical menopause on hormone replacement therapy   . Restless leg syndrome, controlled   . CHF NYHA class I (HCC)     chronic, diastolic  . 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 GERD without esophagitis   . Hypothyroidism, adult   . Chronic renal impairment, stage 3 (moderate)   . Obstructive sleep apnea, adult     CPAP  . Statin intolerance   . Rosacea   . Patent foramen ovale   . Chronic asthma   . Adult attention deficit disorder   . Bipolar disorder ([place])   . Depression   . Hepatitis B   . Hepatitis C   . Hypertension   . Diabetes mellitus without complication ([place])   . Fibromyalgia   . Cancer [facility])     skin    Past Surgical History  Procedure Laterality Date  . Foot surgery Bilateral   . Breast surgery    . Pelvic floor repair    . Abdominal hysterectomy    . Bilateral tubal ligation    . Bladder surgery      "repair"  . Bunionectomy Bilateral   . Breast biopsy Right     negative 3 different biopsies    Prior to Admission medications   Medication Sig Start Date End Date Taking? Authorizing Provider  albuterol (PROAIR HFA) 108 (90 BASE) MCG/ACT inhaler Inhale 1-2 puffs into the lungs every 4 (four) hours as needed for wheezing or shortness of breath. 08/12/14  Yes Bobetta Lime, MD  albuterol (PROVENTIL) (2.5 MG/3ML) 0.083% nebulizer solution Inhale into  the lungs.   Yes Historical Provider, MD  allopurinol (ZYLOPRIM) 100 MG tablet Take 200 mg by mouth.    Yes Historical Provider, MD  ammonium lactate (LAC-HYDRIN) 12 % lotion APPLY TO DAMP SKIN DAILY 09/09/14  Yes Historical Provider, MD  clindamycin (CLINDAGEL) 1 % gel CLINDAMYCIN PHOSPHATE, 1% (External Gel) - Historical Medication  (1 %) Active   Yes Historical Provider, MD  Clobetasol Propionate 0.05 % shampoo  09/10/14  Yes Historical Provider, MD  estradiol (ESTRACE) 0.5 MG tablet  06/13/14  Yes Historical Provider, MD  fluticasone (FLONASE) 50 MCG/ACT nasal spray instill 2 sprays into each nostril NIGHTLY 09/08/14  Yes Historical Provider, MD  furosemide (LASIX) 40 MG tablet Take 40 mg by mouth daily. 09/15/14  Yes Historical Provider, MD  gabapentin (NEURONTIN) 300 MG capsule Take 600 mg by mouth at bedtime. 06/21/14  Yes Historical Provider, MD  levothyroxine (SYNTHROID, LEVOTHROID) 25 MCG tablet Take by mouth.   Yes Historical Provider, MD  losartan (COZAAR) 50 MG tablet Take by mouth. 03/18/14  Yes Historical Provider, MD  metoprolol succinate (TOPROL-XL) 50 MG 24 hr tablet Take 1 tablet (50 mg total) by mouth daily. 07/23/14  Yes Bobetta Lime, MD  minocycline (MINOCIN,DYNACIN) 100 MG capsule Take 1 capsule (100 mg total) by mouth daily. 06/21/14  Yes Bobetta Lime, MD  montelukast (SINGULAIR) 10 MG tablet  09/12/14  Yes Historical Provider, MD  pramipexole (MIRAPEX) 1 MG tablet  09/12/14  Yes Historical Provider, MD  [facility] 160-4.5 MCG/ACT inhaler  06/21/14  Yes Historical Provider, MD  tretinoin (RETIN-A) 0.05 % cream  01/19/14  Yes Historical Provider, MD  ammonium lactate (LAC-HYDRIN) 12 % cream LAC-HYDRIN, 12% (External Cream) - Historical Medication  (12 %) Active    Historical Provider, MD  methylphenidate (RITALIN) 10 MG tablet Take 1 tablet (10 mg total) by mouth 2 (two) times daily. Patient not taking: Reported on 12/06/2014 11/03/14   Larey Seat, MD  montelukast (SINGULAIR) 10  MG tablet Take 10 mg by mouth. 01/27/13 07/29/14  Historical Provider, MD    Allergies as of 10/29/2014 - Review Complete 10/12/2014  Allergen Reaction Noted  . Bee venom Anaphylaxis 07/08/2014  . Desvenlafaxine Rash, Shortness Of Breath, and Anaphylaxis 07/08/2014  . Diltiazem hcl Anaphylaxis 07/08/2014  . Neomycin Anaphylaxis 07/08/2014  . Other Anaphylaxis 07/08/2014  . Pneumococcal polysaccharide vaccine Dermatitis, Rash, and Swelling 07/08/2014  . Prednisone Anaphylaxis 07/08/2014  . Latex Rash 07/08/2014  . Ampicillin Hives 07/08/2014  . Erythromycin Hives 07/08/2014  . Influenza vaccines  07/08/2014  . Lamotrigine Hives 07/08/2014  . Penicillins Nausea And Vomiting 07/08/2014  . Pneumococcal vaccine Hives 07/08/2014  . Tetracycline Nausea And Vomiting 07/08/2014  . Buspirone Rash 07/08/2014  . Cortisone Rash 07/08/2014  . Imipramine Rash 07/08/2014  . Statins Rash 07/08/2014  . Tape Rash 07/08/2014  . Ziprasidone hcl Rash 07/08/2014    Family History  Problem Relation Age of Onset  . Hypertension Mother   . Hypothyroidism Mother   . Heart disease Mother   . Lung cancer Mother   . Alcohol abuse Mother   . Depression Mother   . Hypertension Father   . Heart disease Father   . Alcohol abuse Father   . Hypothyroidism Sister   . Hypertension Sister   . Bipolar disorder Sister   . Heart disease Brother   . Alcohol abuse Brother   . Bipolar disorder Brother   . Hypothyroidism Sister   . Hypertension Sister   . Breast cancer Neg Hx     Social History   Social History  . Marital Status: Married    Spouse Name: Philippa Chester  . Number of Children: 3  . Years of Education: 22   Occupational History  . unemployed    Social History Main Topics  . Smoking status: Never Smoker   . Smokeless tobacco: Never Used     Comment: tried smoking in youth  . Alcohol Use: No     Comment: used when she was 64 years old.   . Drug Use: Yes    Special: LSD, Marijuana     Comment:  07/21/14 "tried in youth"  . Sexual Activity: Yes    Birth Control/ Protection: None   Other Topics Concern  . Not on file   Social History Narrative   Married, lives at home with husband   Caffeine use- 1 soda, 1 tea daily    Review of Systems: See HPI, otherwise negative ROS  Physical Exam: BP 150/50 mmHg  Pulse 60  Temp(Src) 96.8 F (36 C) (Tympanic)  Resp 18  Ht 5\' 2"  (1.575 m)  Wt 73.483 kg (162 lb)  BMI 29.62 kg/m2  SpO2 100% General:   Alert,  pleasant and cooperative in NAD Head:  Normocephalic and atraumatic. Neck:  Supple; no masses or thyromegaly.  Lungs:  Clear throughout to auscultation.    Heart:  Regular rate and rhythm. Abdomen:  Soft, nontender and nondistended. Normal bowel sounds, without guarding, and without rebound.   Neurologic:  Alert and  oriented x4;  grossly normal neurologically.  Impression/Plan: Mikie Battle is here for an colonoscopy to be performed for [facility] colon polyps  Risks, benefits, limitations, and alternatives regarding  colonoscopy have been reviewed with the patient.  Questions have been answered.  All parties agreeable.   Gaylyn Cheers, MD  12/06/2014, 2:26 PM

## 2014-12-06 NOTE — Anesthesia Preprocedure Evaluation (Signed)
Anesthesia Evaluation  Patient identified by MRN, date of birth, ID band Patient awake    Reviewed: Allergy & Precautions, NPO status , Patient's Chart, lab work & pertinent test results  Airway Mallampati: IV  TM Distance: >3 FB Neck ROM: Limited    Dental  (+) Teeth Intact   Pulmonary asthma , sleep apnea and Continuous Positive Airway Pressure Ventilation , COPD,    Pulmonary exam normal        Cardiovascular Exercise Tolerance: Poor hypertension, Pt. on medications and Pt. on home beta blockers + CAD, + Past MI, + Peripheral Vascular Disease and +CHF  Normal cardiovascular exam  PFO, mild CHF.   Neuro/Psych Depression Bipolar Disorder    GI/Hepatic (+) Hepatitis -, C  Endo/Other  diabetesHypothyroidism Diet controlled DM.  Renal/GU Renal InsufficiencyRenal disease     Musculoskeletal   Abdominal (+) + obese,   Peds  Hematology   Anesthesia Other Findings   Reproductive/Obstetrics                             Anesthesia Physical Anesthesia Plan  ASA: III  Anesthesia Plan: General   Post-op Pain Management:    Induction: Intravenous  Airway Management Planned: Nasal Cannula  Additional Equipment:   Intra-op Plan:   Post-operative Plan:   Informed Consent: I have reviewed the patients History and Physical, chart, labs and discussed the procedure including the risks, benefits and alternatives for the proposed anesthesia with the patient or authorized representative who has indicated his/her understanding and acceptance.     Plan Discussed with: CRNA  Anesthesia Plan Comments:         Anesthesia Quick Evaluation

## 2014-12-06 NOTE — Anesthesia Postprocedure Evaluation (Signed)
Anesthesia Post Note  Patient: Jamie Murillo  Procedure(s) Performed: Procedure(s) (LRB): COLONOSCOPY (N/A)  Patient location during evaluation: PACU Anesthesia Type: General Level of consciousness: awake and alert Pain management: pain level controlled Vital Signs Assessment: post-procedure vital signs reviewed and stable Respiratory status: spontaneous breathing Cardiovascular status: blood pressure returned to baseline Postop Assessment: No headache Anesthetic complications: no    Last Vitals:  Filed Vitals:   12/06/14 1342 12/06/14 1452  BP: 150/50   Pulse: 60   Temp: 36 C 35.9 C  Resp: 18     Last Pain: There were no vitals filed for this visit.               Carnell Casamento M

## 2014-12-07 ENCOUNTER — Other Ambulatory Visit: Payer: Self-pay | Admitting: Family Medicine

## 2014-12-08 ENCOUNTER — Encounter: Payer: Self-pay | Admitting: Unknown Physician Specialty

## 2014-12-09 ENCOUNTER — Ambulatory Visit: Payer: PPO | Admitting: Dietician

## 2014-12-16 ENCOUNTER — Encounter: Payer: Self-pay | Admitting: Neurology

## 2014-12-21 ENCOUNTER — Telehealth: Payer: Self-pay

## 2014-12-21 NOTE — Telephone Encounter (Signed)
Spoke with patient regarding her complaints. She is on Auto 5-15. I explained this was the best setting for air swallowing, burping, etc because she is not getting the high pressure only when obstructing. Talked with her about mask. She is on full face. I think she would do better on a nasal or nasal pillows. This would reduce the air swallowing. She is coming December 27th for a mask fitting. She felt better after I talked with her.

## 2014-12-21 NOTE — Telephone Encounter (Signed)
Thank you Robin, and  I am sure she'll be can be helped . What ever increases the patient's comfort level using Pap therapy will benefit our compliance data. Thank you very much.

## 2014-12-24 ENCOUNTER — Telehealth: Payer: Self-pay | Admitting: Dietician

## 2014-12-24 NOTE — Telephone Encounter (Signed)
Called patient to check on progress as she missed her follow-up RD appt on 12/09/14. Left a voicemail message asking if she would like to reschedule; requested a call back to report progress and/or reschedule.

## 2015-01-04 ENCOUNTER — Encounter: Payer: Self-pay | Admitting: Neurology

## 2015-01-04 ENCOUNTER — Telehealth: Payer: Self-pay | Admitting: Neurology

## 2015-01-04 ENCOUNTER — Telehealth: Payer: Self-pay

## 2015-01-04 DIAGNOSIS — G4733 Obstructive sleep apnea (adult) (pediatric): Secondary | ICD-10-CM

## 2015-01-04 DIAGNOSIS — G47411 Narcolepsy with cataplexy: Secondary | ICD-10-CM

## 2015-01-04 DIAGNOSIS — F909 Attention-deficit hyperactivity disorder, unspecified type: Secondary | ICD-10-CM

## 2015-01-04 MED ORDER — METHYLPHENIDATE HCL 10 MG PO TABS: 10.0000 mg | ORAL_TABLET | Freq: Two times a day (BID) | ORAL | Status: DC

## 2015-01-04 NOTE — Telephone Encounter (Signed)
We cannot increase Requip on top of Vyvanse . 10 mg is plenty , I can offer you  Up to 2 a day? CD

## 2015-01-04 NOTE — Telephone Encounter (Signed)
I see Jamie Murillo today here in person she was in our lobby today. She informed me an email this morning that her Ritalin dose is too low to sustain wakefulness throughout the day. She is especially concerned about driving. Grams do help but for a limited amount of time so I will be happy to go to 210 mg Ritalin daily.

## 2015-01-04 NOTE — Telephone Encounter (Signed)
Patient came to sleep lab to look at some nasal pillows to try. She does not like her full face mask. Gave her respironics Nuance medium nasal pillows. She likes this a lot. Will try to wear cpap and hopefully this will reduce air swallowing and  Indigestion.

## 2015-01-05 ENCOUNTER — Other Ambulatory Visit: Payer: Self-pay | Admitting: Nurse Practitioner

## 2015-01-05 DIAGNOSIS — R1310 Dysphagia, unspecified: Secondary | ICD-10-CM

## 2015-01-11 ENCOUNTER — Ambulatory Visit: Payer: PPO | Admitting: Psychiatry

## 2015-01-12 ENCOUNTER — Ambulatory Visit
Admission: RE | Admit: 2015-01-12 | Discharge: 2015-01-12 | Disposition: A | Discharge status: Home / Self Care | Payer: PPO | Source: Ambulatory Visit | Attending: Nurse Practitioner | Admitting: Nurse Practitioner

## 2015-01-12 DIAGNOSIS — R1310 Dysphagia, unspecified: Secondary | ICD-10-CM | POA: Diagnosis not present

## 2015-01-12 DIAGNOSIS — K449 Diaphragmatic hernia without obstruction or gangrene: Secondary | ICD-10-CM | POA: Insufficient documentation

## 2015-01-12 DIAGNOSIS — K219 Gastro-esophageal reflux disease without esophagitis: Secondary | ICD-10-CM | POA: Insufficient documentation

## 2015-01-13 NOTE — Progress Notes (Signed)
Pt did not pick up her adderall 20 mg RX dated August 1st 2016. This RX will be shredded.

## 2015-01-14 ENCOUNTER — Other Ambulatory Visit: Payer: Self-pay | Admitting: Nurse Practitioner

## 2015-01-14 DIAGNOSIS — R933 Abnormal findings on diagnostic imaging of other parts of digestive tract: Secondary | ICD-10-CM

## 2015-01-18 ENCOUNTER — Ambulatory Visit: Admission: RE | Admit: 2015-01-18 | Payer: PPO | Source: Ambulatory Visit

## 2015-01-20 ENCOUNTER — Ambulatory Visit
Admission: RE | Admit: 2015-01-20 | Discharge: 2015-01-20 | Disposition: A | Discharge status: Home / Self Care | Payer: PPO | Source: Ambulatory Visit | Attending: Nurse Practitioner | Admitting: Nurse Practitioner

## 2015-01-20 ENCOUNTER — Other Ambulatory Visit (HOSPITAL_COMMUNITY): Payer: PPO

## 2015-01-20 DIAGNOSIS — M50322 Other cervical disc degeneration at C5-C6 level: Secondary | ICD-10-CM | POA: Insufficient documentation

## 2015-01-20 DIAGNOSIS — E042 Nontoxic multinodular goiter: Secondary | ICD-10-CM | POA: Diagnosis not present

## 2015-01-20 DIAGNOSIS — R938 Abnormal findings on diagnostic imaging of other specified body structures: Secondary | ICD-10-CM | POA: Diagnosis present

## 2015-01-20 DIAGNOSIS — R933 Abnormal findings on diagnostic imaging of other parts of digestive tract: Secondary | ICD-10-CM

## 2015-01-20 DIAGNOSIS — M50323 Other cervical disc degeneration at C6-C7 level: Secondary | ICD-10-CM | POA: Insufficient documentation

## 2015-01-20 HISTORY — DX: Disorder of kidney and ureter, unspecified: N28.9

## 2015-01-20 MED ORDER — IOHEXOL 300 MG/ML  SOLN
75.0000 mL | Freq: Once | INTRAMUSCULAR | Status: AC | PRN
  Administered 2015-01-20: 60 mL via INTRAVENOUS

## 2015-01-20 MED ORDER — IOHEXOL 300 MG/ML  SOLN: 100.0000 mL | Freq: Once | INTRAMUSCULAR | Status: DC | PRN

## 2015-01-21 ENCOUNTER — Encounter: Payer: Self-pay | Admitting: Neurology

## 2015-01-24 ENCOUNTER — Encounter: Payer: Self-pay | Admitting: Dietician

## 2015-01-24 ENCOUNTER — Other Ambulatory Visit: Payer: Self-pay | Admitting: Otolaryngology

## 2015-01-24 DIAGNOSIS — E041 Nontoxic single thyroid nodule: Secondary | ICD-10-CM

## 2015-01-24 NOTE — Progress Notes (Unsigned)
Have not heard from patient to reschedule missed appointment. Sent discharge letter to MD.

## 2015-02-03 ENCOUNTER — Encounter: Payer: Self-pay | Admitting: Adult Health

## 2015-02-03 ENCOUNTER — Ambulatory Visit (INDEPENDENT_AMBULATORY_CARE_PROVIDER_SITE_OTHER): Payer: PPO | Admitting: Adult Health

## 2015-02-03 VITALS — BP 101/60 | HR 65 | Ht 62.0 in | Wt 170.0 lb

## 2015-02-03 DIAGNOSIS — G4733 Obstructive sleep apnea (adult) (pediatric): Secondary | ICD-10-CM | POA: Diagnosis not present

## 2015-02-03 DIAGNOSIS — G47411 Narcolepsy with cataplexy: Secondary | ICD-10-CM | POA: Diagnosis not present

## 2015-02-03 MED ORDER — METHYLPHENIDATE HCL 10 MG PO TABS: 20.0000 mg | ORAL_TABLET | Freq: Every day | ORAL | Status: DC | PRN

## 2015-02-03 NOTE — Progress Notes (Signed)
I agree with the assessment and plan as directed by NP .The patient is known to me .   Decklyn Hyder, MD  

## 2015-02-03 NOTE — Progress Notes (Addendum)
PATIENT: Jamie Murillo DOB: 1950-11-07  REASON FOR VISIT: follow up HISTORY FROM: patient  HISTORY OF PRESENT ILLNESS: Jamie Murillo is a 65 year old female with a history of obstructive sleep apnea as well as narcolepsy. She returns today for an evaluation. The patient's compliance download for her CPAP indicates that she used her machine 13 out of 30 days for compliance of 43%. The patient states that the reason she only used her machine for [address] because it was malfunctioning. She actually had to use her old machine for several weeks until her DME company fix her machine. Her download does reflect this. The patient is on AutoSet with a minimum pressure of 5 cm and maximum pressure 15 cm. Her residual AHI is 4.6. the patient does have a significant leak and the 95th percentile at 34.6. Patient states that she recently received a small face mask but according to her report she continues to have a leak. The patient also states that the mask is causing her face to break out. The patient does not feel that she would be able to use the nasal mask. Patient states that she continues to be sleepy throughout the day. Her typical bedtime is 10 PM and she typically wakes up several times during the night. The patient states that she also struggles with insomnia. She states that she is considering seeing a psychiatrist to try a new antidepressant that also helps with wakefulness. The patient has tried Ritalin but has not seen any benefit. She is questioning if a higher dose would be helpful. She states that she does take the initial dose but typically forgets to take a second dose. She states that she only takes medication if she is going to be driving. She denies any new neurological symptoms. She returns today for an evaluation.  HISTORY (dohmeier): Jamie Murillo has used CPAP since 2011 the studies I have available here are from 2016 generally and show that she is excessively hypersomnia or excessively daytime  sleepy by being sufficiently treated on CPAP. The CPAP interpretation showed a sleep time of 376.5 minutes , a sleep latency of 15 minutes, a sleep efficiency of only 81.1% , REM proportion was 28.3% and the AHI was only 5.4 per hour, the RDI was 6.4 per hour.  During REM sleep AHI was increased to 10.1 and in supine position 5.4 AHI.  The patient had borderline bradycardia , with an average heart rate was 68. BPM however. There were no periodic limb movements . She was titrated to 9 cm water pressure she also was tried on 10 cm water pressure and the technologist noted that she seemed to do better.  It was recommended to use 10 cm water pressure CPAP as the most optimal pressure for her. Patient then was required to stay the next day on 01-20-14 for an MS LT interestingly she was given 5 nap opportunities sleep onsets were supposedly within less than a minute in each of her periods and she had REM onset supposedly and 4 out of 5 naps. 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 Patient was treated with CPAP prior to the MS LT , no need to re-titrate her.  Patient goes to bed between 10 and 11 PM, adheres to sleep hygiene rules. She sleeeps supine, because of the CPAP . She was never tried on a nasal pillow.  She sleeps in a cool, quiet, and dark room,  uses CPAP every night/  Sometimes she will find her self sleep walking, eating, . She will get up about 4.30 to help her husband to get ready, sh is retired.  She struggles all day with hypersomnia. She drinks 2 caffeineated sodas. Ice tea. No ETOH , no tobacco use.  She does not take scheduled naps, and had insomnia before. The current degree of sleepiness begun 3- years ago. In the last 18 month she gained 35 pounds!   Interval history from 09-02-14- I still have no access to a baseline sleep study for this patient, and I'm given data from as early as 2016  reflecting a CPAP titration only but no baseline study. Given the difficulties we have to control the patient's excessive daytime sleepiness, she still endorses the Epworth Sleepiness Scale at 22 points and the fatigue severity scale at 61 points, it would have been helpful to at least obtain a download. These data are not available from her machine either. She is using currently a full face mask, AIR fit F , 10 by [employer]. Given her reported history of imipramine allergy she was not able to take Tofranil she stated. So I have no cataplexy controlling medication for this patient. She carries a diagnosis of ADD and the use of stimulants can treat both excessive daytime sleepiness and ADD, but it is not an optimal treatment option for a lady of 65 years of age. Especially with heart disease Blood pressure, obesity etc.  Interval history from 10-20 6-16. Jamie Murillo underwent a sleep study a baseline polysomnography on 10-17-14.  My concern was that this patient carries a diagnosis of narcolepsy and always a had been re-titrated to a CPAP machine but I had no access to at baseline diagnostic sleep study. For this reason and for her endorsed Epworth Sleepiness Scale at 23 out of 24 points the sleep study was ordered. The patient's sleep latency was 34 minutes REM latency was 62.5 minutes sleep efficiency was 88.4 her AHI was 11.8 which constitutes mild sleep apnea. Nadir of oxygen was 75% but was only 21 minutes total of desaturation time. She did not retain CO2 above 50 torr, the study revealed that obstructive sleep apnea was still present and given the patient's very high degree of sleepiness should be treated. I advised to proceed with an hour told titrate her. It was only yesterday October 25 that we get the green light [facility] and they will provide an auto titrate a setting from 5-15 cm pressure window. She can use any interface to her comfort. She has been using full face masks only in  the past. She would like to continue.   Jamie Murillo doesn't drive and needs an [facility] branch in [place].  Her second sleep disorder narcolepsy had been diagnosed into daytime studies. The medication usually used to treat narcolepsy and is related sleepiness bears however several risks for this patient. She has a history of heart disease and she is in stage III chronic kidney disease. We discussed today the use of a stimulant such as Ritalin or Adderall which will make it easier for her to stay awake in daytime. I am not optimistic that it will reduce her Epworth sleepiness score below 10 but it may work temporarily and give her a predictable time window to operate machinery or car. She endorsed today the Epworth score at 22 points and the fatigue severity score at 55 points. The geriatric depression score was endorsed at 2 points. My plan  is to have Mrs. Wisman use the outflow CPAP and see her after 30 days of using the machine. We will then reobtain an Epworth sleepiness score. In the meantime I will order a very low dose of Ritalin for her, to allow her more wakefulness in daytime. Based on her renal disease and heart condition it is not advisable to treat this patient with Xyrem due to the high sodium load.  REVIEW OF SYSTEMS: Out of a complete 14 system review of symptoms, the patient complains only of the following symptoms, and all other reviewed systems are negative.  Epworth sleepiness score 22, fatigue severity score 61  Appetite change, unexpected weight change, trouble swallowing, chest tightness, leg swelling, frequently taking, daytime sleepiness, acting out dreams, joint pain, back pain, aching muscles, muscle cramps, neck pain, urgency, incontinence of bladder, anemia, weakness, excessive eating  ALLERGIES: Allergies  Allergen Reactions  . Bee Venom Anaphylaxis  . Desvenlafaxine Rash, Shortness Of Breath and Anaphylaxis  . Diltiazem Hcl Anaphylaxis  . Neomycin Anaphylaxis  . Other  Anaphylaxis    pistacchios  . Pneumococcal Polysaccharide Vaccine Dermatitis, Rash and Swelling  . Prednisone Anaphylaxis  . Latex Rash  . Ampicillin Hives  . Erythromycin Hives  . Influenza Vaccines     Patient has a history of anaphylaxis to neomycin and other agents of that class.    . Lamotrigine Hives  . Penicillins Nausea And Vomiting  . Pneumococcal Vaccine Hives  . Tetracycline Nausea And Vomiting  . Buspirone Rash  . Cortisone Rash  . Imipramine Rash  . Statins Rash    Statin induced myopathy  . Tape Rash    BUSPAR. BUSPAR SEPTRA. SEPTRA  . Ziprasidone Hcl Rash    HOME MEDICATIONS: Outpatient Prescriptions Prior to Visit  Medication Sig Dispense Refill  . albuterol (PROAIR HFA) 108 (90 BASE) MCG/ACT inhaler Inhale 1-2 puffs into the lungs every 4 (four) hours as needed for wheezing or shortness of breath. 1 Inhaler 3  . allopurinol (ZYLOPRIM) 100 MG tablet Take 200 mg by mouth.     Marland Kitchen ammonium lactate (LAC-HYDRIN) 12 % lotion APPLY TO DAMP SKIN DAILY  1  . clindamycin (CLINDAGEL) 1 % gel CLINDAMYCIN PHOSPHATE, 1% (External Gel) - Historical Medication  (1 %) Active    . Clobetasol Propionate 0.05 % shampoo   0  . estradiol (ESTRACE) 0.5 MG tablet   0  . fluticasone (FLONASE) 50 MCG/ACT nasal spray instill 2 sprays into each nostril NIGHTLY  0  . furosemide (LASIX) 40 MG tablet Take 40 mg by mouth daily.  0  . gabapentin (NEURONTIN) 300 MG capsule Take 600 mg by mouth at bedtime.  0  . levothyroxine (SYNTHROID, LEVOTHROID) 25 MCG tablet Take by mouth.    . losartan (COZAAR) 50 MG tablet Take by mouth.    . methylphenidate (RITALIN) 10 MG tablet Take 1 tablet (10 mg total) by mouth 2 (two) times daily. 60 tablet 0  . metoprolol succinate (TOPROL-XL) 50 MG 24 hr tablet Take 1 tablet (50 mg total) by mouth daily. 90 tablet 3  . montelukast (SINGULAIR) 10 MG tablet   0  . pramipexole (MIRAPEX) 1 MG tablet   0  . SYMBICORT 160-4.5 MCG/ACT inhaler inhale 2 puffs by mouth  twice a day 10.2 Inhaler 2  . tretinoin (RETIN-A) 0.05 % cream     . albuterol (PROVENTIL) (2.5 MG/3ML) 0.083% nebulizer solution Inhale into the lungs. Reported on 02/03/2015    . ammonium lactate (LAC-HYDRIN) 12 % cream  LAC-HYDRIN, 12% (External Cream) - Historical Medication  (12 %) Active    . minocycline (MINOCIN,DYNACIN) 100 MG capsule Take 1 capsule (100 mg total) by mouth daily. 90 capsule 1  . montelukast (SINGULAIR) 10 MG tablet Take 10 mg by mouth.     No facility-administered medications prior to visit.    PAST MEDICAL HISTORY: Past Medical History  Diagnosis Date  . History of chronic hepatitis     Hepatitis B and C  . Raynaud's disease without gangrene   . Gout   . Secondary hyperparathyroidism, renal ([place])   . Postural orthostatic tachycardia syndrome   . Coronary artery disease with history of myocardial infarction without history of CABG   . Surgical menopause on hormone replacement therapy   . Restless leg syndrome, controlled   . CHF NYHA class I ([facility])     chronic, diastolic  . 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 GERD without esophagitis   . Hypothyroidism, adult   . Chronic renal impairment, stage 3 (moderate)   . Obstructive sleep apnea, adult     CPAP  . Statin intolerance   . Rosacea   . Patent foramen ovale   . Chronic asthma   . Adult attention deficit disorder   . Bipolar disorder ([place])   . Depression   . Hepatitis B   . Hepatitis C   . Hypertension   . Diabetes mellitus without complication ([place])   . Fibromyalgia   . Cancer ([place])     skin  . Renal insufficiency     2008 per pt    PAST SURGICAL HISTORY: Past Surgical History  Procedure Laterality Date  . Foot surgery Bilateral   . Breast surgery    . Pelvic floor repair    . Abdominal hysterectomy    . Bilateral tubal ligation    . Bladder surgery      "repair"  . Bunionectomy Bilateral   .  Breast biopsy Right     negative 3 different biopsies  . Colonoscopy N/A 12/06/2014    Procedure: COLONOSCOPY;  Surgeon: Manya Silvas, MD;  Location: [facility] ENDOSCOPY;  Service: Endoscopy;  Laterality: N/A;    FAMILY HISTORY: Family History  Problem Relation Age of Onset  . Hypertension Mother   . Hypothyroidism Mother   . Heart disease Mother   . Lung cancer Mother   . Alcohol abuse Mother   . Depression Mother   . Hypertension Father   . Heart disease Father   . Alcohol abuse Father   . Hypothyroidism Sister   . Hypertension Sister   . Bipolar disorder Sister   . Heart disease Brother   . Alcohol abuse Brother   . Bipolar disorder Brother   . Hypothyroidism Sister   . Hypertension Sister   . Breast cancer Neg Hx     SOCIAL HISTORY: Social History   Social History  . Marital Status: Married    Spouse Name: Philippa Chester  . Number of Children: 3  . Years of Education: 22   Occupational History  . unemployed    Social History Main Topics  . Smoking status: Never Smoker   . Smokeless tobacco: Never Used     Comment: tried smoking in youth  . Alcohol Use: No     Comment: used when she was 65 years old.   . Drug Use: Yes    Special: LSD, Marijuana  Comment: 07/21/14 "tried in youth"  . Sexual Activity: Yes    Birth Control/ Protection: None   Other Topics Concern  . Not on file   Social History Narrative   Married, lives at home with husband   Caffeine use- 1 soda, 1 tea daily      PHYSICAL EXAM  Filed Vitals:   02/03/15 0844  BP: 101/60  Pulse: 65  Height: 5\' 2"  (1.575 m)  Weight: 170 lb (77.111 kg)   Body mass index is 31.09 kg/(m^2).  Generalized: Well developed, in no acute distress  Neck: Circumference 16 inches, Mallampati 4+  Neurological examination  Mentation: Alert oriented to time, place, history taking. Follows all commands speech and language fluent. Flat affect Cranial nerve II-XII: Pupils were equal round reactive to light.  Extraocular movements were full, visual field were full on confrontational test. Facial sensation and strength were normal. Uvula tongue midline. Head turning and shoulder shrug  were normal and symmetric. Motor: The motor testing reveals 5 over 5 strength of all 4 extremities. Good symmetric motor tone is noted throughout.  Sensory: Sensory testing is intact to soft touch on all 4 extremities. No evidence of extinction is noted.  Coordination: Cerebellar testing reveals good finger-nose-finger and heel-to-shin bilaterally.  Gait and station: Gait is normal. Tandem gait is normal. Romberg is negative. No drift is seen.  Reflexes: Deep tendon reflexes are symmetric and normal bilaterally.   DIAGNOSTIC DATA (LABS, IMAGING, TESTING) - I reviewed patient records, labs, notes, testing and imaging myself where available.   ASSESSMENT AND PLAN 65 y.o. 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 here with:  1. Obstructive sleep apnea on CPAP 2. Narcolepsy  The patient is encouraged to continue using her CPAP nightly. She will stop by the sleep  lab today to have her mask refitted or try a new mask. The patient will increase Ritalin to 20 mg daily as needed. If this is not beneficial in this medication will be discontinued. The patient's heart rate and blood pressure is in normal range. Patient advised that if her symptoms worsen or she develops any new symptoms she should let us know. She will follow-up in 4 months with Dr. Mechele Claude, MSN, NP-C 02/03/2015, 9:01 AM [facility] [address], [place], [place] [address] [phone]

## 2015-02-03 NOTE — Patient Instructions (Signed)
Increase Ritalin to 20 mg once a day if needed Continue using CPAP nightly If your symptoms worsen or you develop new symptoms please let us know.

## 2015-02-04 ENCOUNTER — Telehealth: Payer: Self-pay

## 2015-02-04 NOTE — Telephone Encounter (Signed)
Did mask fitting on patient. Fitted her for F&P simplus size small. Gave her information on liners for mask she can purchase from [email or web address] Her other full face mask irritated her skin.

## 2015-02-11 ENCOUNTER — Encounter: Payer: Self-pay | Admitting: *Deleted

## 2015-02-14 ENCOUNTER — Ambulatory Visit: Payer: PPO | Admitting: Anesthesiology

## 2015-02-14 ENCOUNTER — Ambulatory Visit
Admission: RE | Admit: 2015-02-14 | Discharge: 2015-02-14 | Disposition: A | Discharge status: Home / Self Care | Payer: PPO | Source: Ambulatory Visit | Attending: Unknown Physician Specialty | Admitting: Unknown Physician Specialty

## 2015-02-14 ENCOUNTER — Encounter
Admission: RE | Disposition: A | Discharge status: Home / Self Care | Payer: Self-pay | Source: Ambulatory Visit | Attending: Unknown Physician Specialty

## 2015-02-14 DIAGNOSIS — I129 Hypertensive chronic kidney disease with stage 1 through stage 4 chronic kidney disease, or unspecified chronic kidney disease: Secondary | ICD-10-CM | POA: Diagnosis not present

## 2015-02-14 DIAGNOSIS — R1310 Dysphagia, unspecified: Secondary | ICD-10-CM | POA: Insufficient documentation

## 2015-02-14 DIAGNOSIS — Z881 Allergy status to other antibiotic agents status: Secondary | ICD-10-CM | POA: Insufficient documentation

## 2015-02-14 DIAGNOSIS — M479 Spondylosis, unspecified: Secondary | ICD-10-CM | POA: Insufficient documentation

## 2015-02-14 DIAGNOSIS — Z91048 Other nonmedicinal substance allergy status: Secondary | ICD-10-CM | POA: Diagnosis not present

## 2015-02-14 DIAGNOSIS — M109 Gout, unspecified: Secondary | ICD-10-CM | POA: Diagnosis not present

## 2015-02-14 DIAGNOSIS — J45909 Unspecified asthma, uncomplicated: Secondary | ICD-10-CM | POA: Insufficient documentation

## 2015-02-14 DIAGNOSIS — Z91040 Latex allergy status: Secondary | ICD-10-CM | POA: Insufficient documentation

## 2015-02-14 DIAGNOSIS — G4733 Obstructive sleep apnea (adult) (pediatric): Secondary | ICD-10-CM | POA: Insufficient documentation

## 2015-02-14 DIAGNOSIS — N183 Chronic kidney disease, stage 3 (moderate): Secondary | ICD-10-CM | POA: Insufficient documentation

## 2015-02-14 DIAGNOSIS — K219 Gastro-esophageal reflux disease without esophagitis: Secondary | ICD-10-CM | POA: Insufficient documentation

## 2015-02-14 DIAGNOSIS — F988 Other specified behavioral and emotional disorders with onset usually occurring in childhood and adolescence: Secondary | ICD-10-CM | POA: Diagnosis not present

## 2015-02-14 DIAGNOSIS — E039 Hypothyroidism, unspecified: Secondary | ICD-10-CM | POA: Diagnosis not present

## 2015-02-14 DIAGNOSIS — G2581 Restless legs syndrome: Secondary | ICD-10-CM | POA: Diagnosis not present

## 2015-02-14 DIAGNOSIS — Z79899 Other long term (current) drug therapy: Secondary | ICD-10-CM | POA: Diagnosis not present

## 2015-02-14 DIAGNOSIS — Z888 Allergy status to other drugs, medicaments and biological substances status: Secondary | ICD-10-CM | POA: Insufficient documentation

## 2015-02-14 DIAGNOSIS — I2510 Atherosclerotic heart disease of native coronary artery without angina pectoris: Secondary | ICD-10-CM | POA: Insufficient documentation

## 2015-02-14 DIAGNOSIS — E785 Hyperlipidemia, unspecified: Secondary | ICD-10-CM | POA: Insufficient documentation

## 2015-02-14 DIAGNOSIS — E119 Type 2 diabetes mellitus without complications: Secondary | ICD-10-CM | POA: Insufficient documentation

## 2015-02-14 DIAGNOSIS — Q211 Atrial septal defect: Secondary | ICD-10-CM | POA: Diagnosis not present

## 2015-02-14 DIAGNOSIS — Z91030 Bee allergy status: Secondary | ICD-10-CM | POA: Diagnosis not present

## 2015-02-14 DIAGNOSIS — Z880 Allergy status to penicillin: Secondary | ICD-10-CM | POA: Insufficient documentation

## 2015-02-14 DIAGNOSIS — Z79890 Hormone replacement therapy (postmenopausal): Secondary | ICD-10-CM | POA: Diagnosis not present

## 2015-02-14 DIAGNOSIS — I7300 Raynaud's syndrome without gangrene: Secondary | ICD-10-CM | POA: Insufficient documentation

## 2015-02-14 DIAGNOSIS — F319 Bipolar disorder, unspecified: Secondary | ICD-10-CM | POA: Diagnosis not present

## 2015-02-14 DIAGNOSIS — I5032 Chronic diastolic (congestive) heart failure: Secondary | ICD-10-CM | POA: Diagnosis not present

## 2015-02-14 DIAGNOSIS — K222 Esophageal obstruction: Secondary | ICD-10-CM | POA: Insufficient documentation

## 2015-02-14 DIAGNOSIS — J449 Chronic obstructive pulmonary disease, unspecified: Secondary | ICD-10-CM | POA: Insufficient documentation

## 2015-02-14 DIAGNOSIS — I252 Old myocardial infarction: Secondary | ICD-10-CM | POA: Diagnosis not present

## 2015-02-14 DIAGNOSIS — Z85828 Personal history of other malignant neoplasm of skin: Secondary | ICD-10-CM | POA: Diagnosis not present

## 2015-02-14 DIAGNOSIS — M797 Fibromyalgia: Secondary | ICD-10-CM | POA: Insufficient documentation

## 2015-02-14 DIAGNOSIS — Z887 Allergy status to serum and vaccine status: Secondary | ICD-10-CM | POA: Insufficient documentation

## 2015-02-14 DIAGNOSIS — B1920 Unspecified viral hepatitis C without hepatic coma: Secondary | ICD-10-CM | POA: Diagnosis not present

## 2015-02-14 HISTORY — DX: Hyperlipidemia, unspecified: E78.5

## 2015-02-14 HISTORY — DX: Cardiac murmur, unspecified: R01.1

## 2015-02-14 HISTORY — DX: Anemia, unspecified: D64.9

## 2015-02-14 HISTORY — DX: Raynaud's syndrome without gangrene: I73.00

## 2015-02-14 HISTORY — PX: ESOPHAGOGASTRODUODENOSCOPY (EGD) WITH PROPOFOL: SHX5813

## 2015-02-14 HISTORY — DX: Syncope and collapse: R55

## 2015-02-14 HISTORY — DX: Cardiac arrhythmia, unspecified: I49.9

## 2015-02-14 HISTORY — DX: Chronic obstructive pulmonary disease, unspecified: J44.9

## 2015-02-14 SURGERY — ESOPHAGOGASTRODUODENOSCOPY (EGD) WITH PROPOFOL
Anesthesia: General

## 2015-02-14 MED ORDER — SODIUM CHLORIDE 0.9 % IV SOLN
INTRAVENOUS | Status: DC
  Administered 2015-02-14: 14:00:00 via INTRAVENOUS
  Administered 2015-02-14: 1000 mL via INTRAVENOUS

## 2015-02-14 MED ORDER — MIDAZOLAM HCL 5 MG/5ML IJ SOLN
INTRAMUSCULAR | Status: DC | PRN
  Administered 2015-02-14: 1 mg via INTRAVENOUS

## 2015-02-14 MED ORDER — PROPOFOL 10 MG/ML IV BOLUS
INTRAVENOUS | Status: DC | PRN
  Administered 2015-02-14: 30 mg via INTRAVENOUS
  Administered 2015-02-14: 80 mg via INTRAVENOUS
  Administered 2015-02-14: 50 mg via INTRAVENOUS
  Administered 2015-02-14: 30 mg via INTRAVENOUS

## 2015-02-14 MED ORDER — SODIUM CHLORIDE 0.9 % IV SOLN: INTRAVENOUS | Status: DC

## 2015-02-14 MED ORDER — FENTANYL CITRATE (PF) 100 MCG/2ML IJ SOLN
INTRAMUSCULAR | Status: DC | PRN
  Administered 2015-02-14: 50 ug via INTRAVENOUS

## 2015-02-14 NOTE — H&P (Signed)
Primary Care Physician:  Idelle Crouch, MD Primary Gastroenterologist:  Dr. Vira Agar  Pre-Procedure History & Physical: HPI:  Laurena Fenter is a 65 y.o. female is here for an endoscopy.   Past Medical History  Diagnosis Date  . History of chronic hepatitis     Hepatitis B and C  . Raynaud's disease without gangrene   . Gout   . Secondary hyperparathyroidism, renal ([place])   . Postural orthostatic tachycardia syndrome   . Coronary artery disease with history of myocardial infarction without history of CABG   . Surgical menopause on hormone replacement therapy   . Restless leg syndrome, controlled   . CHF NYHA class I (HCC)     chronic, diastolic  . 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 GERD without esophagitis   . Hypothyroidism, adult   . Chronic renal impairment, stage 3 (moderate)   . Obstructive sleep apnea, adult     CPAP  . Statin intolerance   . Rosacea   . Patent foramen ovale   . Chronic asthma   . Adult attention deficit disorder   . Bipolar disorder ([place])   . Depression   . Hepatitis B   . Hepatitis C   . Hypertension   . Diabetes mellitus without complication ([place])   . Fibromyalgia   . Cancer ([place])     skin  . Renal insufficiency     2008 per pt  . Anemia   . Dysrhythmia   . COPD (chronic obstructive pulmonary disease) ([place])   . Fibromyalgia   . Gout   . Heart murmur   . Hyperlipidemia   . Raynaud's disease   . Syncope and collapse     Past Surgical History  Procedure Laterality Date  . Foot surgery Bilateral   . Breast surgery    . Pelvic floor repair    . Abdominal hysterectomy    . Bilateral tubal ligation    . Bladder surgery      "repair"  . Bunionectomy Bilateral   . Breast biopsy Right     negative 3 different biopsies  . Colonoscopy N/A 12/06/2014    Procedure: COLONOSCOPY;  Surgeon: Manya Silvas, MD;  Location: [facility] ENDOSCOPY;  Service:  Endoscopy;  Laterality: N/A;  . Cardiac catheterization    . Tubal ligation    . Appendectomy      Prior to Admission medications   Medication Sig Start Date End Date Taking? Authorizing Provider  allopurinol (ZYLOPRIM) 100 MG tablet Take 200 mg by mouth.    Yes Historical Provider, MD  amphetamine-dextroamphetamine (ADDERALL) 20 MG tablet Take 20 mg by mouth daily.   Yes Historical Provider, MD  EPINEPHrine 0.3 mg/0.3 mL IJ SOAJ injection Inject into the muscle once.   Yes Historical Provider, MD  HYDROcodone-homatropine (HYCODAN) 5-1.5 MG/5ML syrup Take 5 mLs by mouth every 6 (six) hours as needed for cough.   Yes Historical Provider, MD  imipramine (TOFRANIL-PM) 75 MG capsule Take 75 mg by mouth at bedtime.   Yes Historical Provider, MD  levothyroxine (SYNTHROID, LEVOTHROID) 25 MCG tablet Take by mouth.   Yes Historical Provider, MD  metoprolol succinate (TOPROL-XL) 50 MG 24 hr tablet Take 1 tablet (50 mg total) by mouth daily. 07/23/14  Yes Bobetta Lime, MD  minocycline (MINOCIN,DYNACIN) 100 MG capsule Take 100 mg by mouth 2 (two) times daily.   Yes Historical Provider, MD  modafinil (PROVIGIL) 200 MG tablet  Take 200 mg by mouth daily.   Yes Historical Provider, MD  tizanidine (ZANAFLEX) 2 MG capsule Take 2 mg by mouth 3 (three) times daily.   Yes Historical Provider, MD  albuterol (PROAIR HFA) 108 (90 BASE) MCG/ACT inhaler Inhale 1-2 puffs into the lungs every 4 (four) hours as needed for wheezing or shortness of breath. 08/12/14   Bobetta Lime, MD  albuterol (PROVENTIL) (2.5 MG/3ML) 0.083% nebulizer solution Inhale into the lungs. Reported on 02/03/2015    Historical Provider, MD  ammonium lactate (LAC-HYDRIN) 12 % lotion APPLY TO DAMP SKIN DAILY 09/09/14   Historical Provider, MD  clindamycin (CLINDAGEL) 1 % gel CLINDAMYCIN PHOSPHATE, 1% (External Gel) - Historical Medication  (1 %) Active    Historical Provider, MD  Clobetasol Propionate 0.05 % shampoo  09/10/14   Historical Provider,  MD  estradiol (ESTRACE) 0.5 MG tablet  06/13/14   Historical Provider, MD  fluticasone (FLONASE) 50 MCG/ACT nasal spray instill 2 sprays into each nostril NIGHTLY 09/08/14   Historical Provider, MD  furosemide (LASIX) 40 MG tablet Take 40 mg by mouth daily. 09/15/14   Historical Provider, MD  gabapentin (NEURONTIN) 300 MG capsule Take 600 mg by mouth at bedtime. 06/21/14   Historical Provider, MD  losartan (COZAAR) 50 MG tablet Take by mouth. 03/18/14   Historical Provider, MD  methylphenidate (RITALIN) 10 MG tablet Take 2 tablets (20 mg total) by mouth daily as needed (for sleepiness). 02/03/15   Ward Givens, NP  montelukast (SINGULAIR) 10 MG tablet  09/12/14   Historical Provider, MD  pantoprazole (PROTONIX) 40 MG tablet Take 40 mg by mouth daily. 01/07/15   Historical Provider, MD  pramipexole (MIRAPEX) 1 MG tablet  09/12/14   Historical Provider, MD  SYMBICORT 160-4.5 MCG/ACT inhaler inhale 2 puffs by mouth twice a day 12/08/14   Bobetta Lime, MD  tretinoin (RETIN-A) 0.05 % cream  01/19/14   Historical Provider, MD    Allergies as of 02/01/2015 - Review Complete 01/20/2015  Allergen Reaction Noted  . Bee venom Anaphylaxis 07/08/2014  . Desvenlafaxine Rash, Shortness Of Breath, and Anaphylaxis 07/08/2014  . Diltiazem hcl Anaphylaxis 07/08/2014  . Neomycin Anaphylaxis 07/08/2014  . Other Anaphylaxis 07/08/2014  . Pneumococcal polysaccharide vaccine Dermatitis, Rash, and Swelling 07/08/2014  . Prednisone Anaphylaxis 07/08/2014  . Latex Rash 07/08/2014  . Ampicillin Hives 07/08/2014  . Erythromycin Hives 07/08/2014  . Influenza vaccines  07/08/2014  . Lamotrigine Hives 07/08/2014  . Penicillins Nausea And Vomiting 07/08/2014  . Pneumococcal vaccine Hives 07/08/2014  . Tetracycline Nausea And Vomiting 07/08/2014  . Buspirone Rash 07/08/2014  . Cortisone Rash 07/08/2014  . Imipramine Rash 07/08/2014  . Statins Rash 07/08/2014  . Tape Rash 07/08/2014  . Ziprasidone hcl Rash 07/08/2014     Family History  Problem Relation Age of Onset  . Hypertension Mother   . Hypothyroidism Mother   . Heart disease Mother   . Lung cancer Mother   . Alcohol abuse Mother   . Depression Mother   . Hypertension Father   . Heart disease Father   . Alcohol abuse Father   . Hypothyroidism Sister   . Hypertension Sister   . Bipolar disorder Sister   . Heart disease Brother   . Alcohol abuse Brother   . Bipolar disorder Brother   . Hypothyroidism Sister   . Hypertension Sister   . Breast cancer Neg Hx     Social History   Social History  . Marital Status: Married    Spouse Name:  Philippa Chester  . Number of Children: 3  . Years of Education: 22   Occupational History  . unemployed    Social History Main Topics  . Smoking status: Never Smoker   . Smokeless tobacco: Never Used     Comment: tried smoking in youth  . Alcohol Use: No     Comment: used when she was 65 years old.   . Drug Use: Yes    Special: LSD, Marijuana     Comment: 07/21/14 "tried in youth"  . Sexual Activity: Yes    Birth Control/ Protection: None   Other Topics Concern  . Not on file   Social History Narrative   Married, lives at home with husband   Caffeine use- 1 soda, 1 tea daily    Review of Systems: See HPI, otherwise negative ROS  Physical Exam: BP 128/63 mmHg  Temp(Src) 96.4 F (35.8 C)  Resp 20  SpO2 100% General:   Alert,  pleasant and cooperative in NAD Head:  Normocephalic and atraumatic. Neck:  Supple; no masses or thyromegaly. Lungs:  Clear throughout to auscultation.    Heart:  Regular rate and rhythm. Abdomen:  Soft, nontender and nondistended. Normal bowel sounds, without guarding, and without rebound.   Neurologic:  Alert and  oriented x4;  grossly normal neurologically.  Impression/Plan: Myasiah Sjoblom is here for an endoscopy to be performed for dysphagia  Risks, benefits, limitations, and alternatives regarding  endoscopy have been reviewed with the patient.  Questions  have been answered.  All parties agreeable.   Gaylyn Cheers, MD  02/14/2015, 2:17 PM

## 2015-02-14 NOTE — Anesthesia Postprocedure Evaluation (Signed)
Anesthesia Post Note  Patient: Jamie Murillo  Procedure(s) Performed: Procedure(s) (LRB): ESOPHAGOGASTRODUODENOSCOPY (EGD) WITH PROPOFOL (N/A)  Patient location during evaluation: Endoscopy Anesthesia Type: General Level of consciousness: awake and alert Pain management: pain level controlled Vital Signs Assessment: post-procedure vital signs reviewed and stable Respiratory status: spontaneous breathing and respiratory function stable Cardiovascular status: stable Anesthetic complications: no    Last Vitals:  Filed Vitals:   02/14/15 1342  BP: 128/63  Temp: 35.8 C  Resp: 20    Last Pain: There were no vitals filed for this visit.               Tamey Wanek K

## 2015-02-14 NOTE — Anesthesia Preprocedure Evaluation (Addendum)
Anesthesia Evaluation  Patient identified by MRN, date of birth, ID band Patient awake    Reviewed: Allergy & Precautions, NPO status , Patient's Chart, lab work & pertinent test results  History of Anesthesia Complications Negative for: history of anesthetic complications  Airway Mallampati: III       Dental  (+) Upper Dentures, Lower Dentures   Pulmonary asthma , sleep apnea , COPD,  COPD inhaler,           Cardiovascular hypertension, Pt. on medications and Pt. on home beta blockers + CAD, + Past MI, + Peripheral Vascular Disease and +CHF (LVEF 55%)  + dysrhythmias + Valvular Problems/Murmurs (Patet foramen ovale)      Neuro/Psych Depression Bipolar Disorder    GI/Hepatic GERD  Medicated and Controlled,(+) Hepatitis -, B, C  Endo/Other  diabetesHypothyroidism   Renal/GU Renal InsufficiencyRenal disease     Musculoskeletal  (+) Fibromyalgia -  Abdominal   Peds  Hematology  (+) anemia ,   Anesthesia Other Findings   Reproductive/Obstetrics                           Anesthesia Physical Anesthesia Plan  ASA: III  Anesthesia Plan: General   Post-op Pain Management:    Induction: Intravenous  Airway Management Planned: Nasal Cannula  Additional Equipment:   Intra-op Plan:   Post-operative Plan:   Informed Consent: I have reviewed the patients History and Physical, chart, labs and discussed the procedure including the risks, benefits and alternatives for the proposed anesthesia with the patient or authorized representative who has indicated his/her understanding and acceptance.     Plan Discussed with:   Anesthesia Plan Comments:         Anesthesia Quick Evaluation

## 2015-02-14 NOTE — Op Note (Signed)
[facility] Gastroenterology Patient Name: Jamie Murillo Procedure Date: 02/14/2015 2:20 PM MRN: [id number] Account #: [id number] Date of Birth: 12-12-1950 Admit Type: Outpatient Age: 65 Room: [facility] ENDO ROOM 1 Gender: Female Note Status: Finalized Procedure:         Upper GI endoscopy Indications:       Dysphagia Providers:         Manya Silvas, MD Referring MD:      Leonie Douglas. Doy Hutching, MD (Referring MD) Medicines:         Propofol per Anesthesia Complications:     No immediate complications. Procedure:         Pre-Anesthesia Assessment:                    - After reviewing the risks and benefits, the patient was                     deemed in satisfactory condition to undergo the procedure.                    After obtaining informed consent, the endoscope was passed                     under direct vision. Throughout the procedure, the                     patient's blood pressure, pulse, and oxygen saturations                     were monitored continuously. The Endoscope was introduced                     through the mouth, and advanced to the second part of                     duodenum. The upper GI endoscopy was accomplished without                     difficulty. The patient tolerated the procedure well. Findings:      A non-obstructing Schatzki ring (acquired) was found at the       gastroesophageal junction. A guidewire was placed and the scope was       withdrawn. Dilation was performed with a Savary dilator with mild       resistance at 16 mm and 17 mm.      The stomach was normal.      The examined duodenum was normal. Impression:        - Non-obstructing Schatzki ring. Dilated.                    - Normal stomach.                    - Normal examined duodenum.                    - No specimens collected. Recommendation:    - soft food for 3 days, eat slowly, chew well, take small                     bites Manya Silvas, MD 02/14/2015 2:33:21  PM This report has been signed electronically. Number of Addenda: 0 Note Initiated On: 02/14/2015 2:20 PM      [facility]

## 2015-02-14 NOTE — Transfer of Care (Signed)
Immediate Anesthesia Transfer of Care Note  Patient: Jamie Murillo  Procedure(s) Performed: Procedure(s): ESOPHAGOGASTRODUODENOSCOPY (EGD) WITH PROPOFOL (N/A)  Patient Location: PACU  Anesthesia Type:General  Level of Consciousness: sedated  Airway & Oxygen Therapy: Patient Spontanous Breathing  Post-op Assessment: Report given to RN  Post vital signs: Reviewed  Last Vitals:  Filed Vitals:   02/14/15 1342  BP: 128/63  Temp: 35.8 C  Resp: 20    Complications: No apparent anesthesia complications

## 2015-02-15 ENCOUNTER — Encounter: Payer: Self-pay | Admitting: Unknown Physician Specialty

## 2015-02-25 ENCOUNTER — Encounter: Payer: Self-pay | Admitting: Neurology

## 2015-05-31 ENCOUNTER — Encounter: Payer: Self-pay | Admitting: Neurology

## 2015-05-31 ENCOUNTER — Ambulatory Visit (INDEPENDENT_AMBULATORY_CARE_PROVIDER_SITE_OTHER): Payer: PPO | Admitting: Neurology

## 2015-05-31 VITALS — BP 120/88 | HR 70 | Resp 20 | Ht 62.0 in | Wt 177.0 lb

## 2015-05-31 DIAGNOSIS — G4733 Obstructive sleep apnea (adult) (pediatric): Secondary | ICD-10-CM | POA: Diagnosis not present

## 2015-05-31 DIAGNOSIS — R400 Somnolence: Secondary | ICD-10-CM | POA: Diagnosis not present

## 2015-05-31 DIAGNOSIS — G47411 Narcolepsy with cataplexy: Secondary | ICD-10-CM | POA: Insufficient documentation

## 2015-05-31 DIAGNOSIS — Z9989 Dependence on other enabling machines and devices: Secondary | ICD-10-CM

## 2015-05-31 MED ORDER — AMPHETAMINE-DEXTROAMPHET ER 30 MG PO CP24: 30.0000 mg | ORAL_CAPSULE | Freq: Every day | ORAL | Status: DC

## 2015-05-31 NOTE — Patient Instructions (Signed)
Amphetamine; Dextroamphetamine tablets What is this medicine? AMPHETAMINE; DEXTROAMPHETAMINE(am FET a meen; dex troe am FET a meen) is used to treat attention-deficit hyperactivity disorder (ADHD). It may also be used for narcolepsy. Federal law prohibits giving this medicine to any person other than the person for whom it was prescribed. Do not share this medicine with anyone else. This medicine may be used for other purposes; ask your health care provider or pharmacist if you have questions. What should I tell my health care provider before I take this medicine? They need to know if you have any of these conditions: -anxiety or panic attacks -circulation problems in fingers and toes -glaucoma -hardening or blockages of the arteries or heart blood vessels -heart disease or a heart defect -high blood pressure -history of a drug or alcohol abuse problem -history of stroke -kidney disease -liver disease -mental illness -seizures -suicidal thoughts, plans, or attempt; a previous suicide attempt by you or a family member -thyroid disease -Tourette's syndrome -an unusual or allergic reaction to dextroamphetamine, other amphetamines, other medicines, foods, dyes, or preservatives -pregnant or trying to get pregnant -breast-feeding How should I use this medicine? Take this medicine by mouth with a glass of water. Follow the directions on the prescription label. Take your doses at regular intervals. Do not take your medicine more often than directed. Do not suddenly stop your medicine. You must gradually reduce the dose or you may feel withdrawal effects. Ask your doctor or health care professional for advice. Talk to your pediatrician regarding the use of this medicine in children. Special care may be needed. While this drug may be prescribed for children as young as 3 years for selected conditions, precautions do apply. Overdosage: If you think you have taken too much of this medicine contact a  poison control center or emergency room at once. NOTE: This medicine is only for you. Do not share this medicine with others. What if I miss a dose? If you miss a dose, take it as soon as you can. If it is almost time for your next dose, take only that dose. Do not take double or extra doses. What may interact with this medicine? Do not take this medicine with any of the following medications: -MAOIS like Carbex, Eldepryl, Marplan, Nardil, and Parnate -other stimulant medicines for attention disorders, weight loss, or to stay awake This medicine may also interact with the following medications: -acetazolamide -ammonium chloride -antacids -ascorbic acid -atomoxetine -caffeine -certain medicines for blood pressure -certain medicines for depression, anxiety, or psychotic disturbances -certain medicines for seizures like carbamazepine, phenobarbital, phenytoin -certain medicines for stomach problems like cimetidine, famotidine, omeprazole, lansoprazole -cold or allergy medicines -glutamic acid -lithium -meperidine -methenamine; sodium acid phosphate -narcotic medicines for pain -norepinephrine -phenothiazines like chlorpromazine, mesoridazine, prochlorperazine, thioridazine -sodium acid phosphate -sodium bicarbonate This list may not describe all possible interactions. Give your health care provider a list of all the medicines, herbs, non-prescription drugs, or dietary supplements you use. Also tell them if you smoke, drink alcohol, or use illegal drugs. Some items may interact with your medicine. What should I watch for while using this medicine? Visit your doctor or health care professional for regular checks on your progress. This prescription requires that you follow special procedures with your doctor and pharmacy. You will need to have a new written prescription from your doctor every time you need a refill. This medicine may affect your concentration, or hide signs of tiredness.  Until you know how this medicine affects you, do  not drive, ride a bicycle, use machinery, or do anything that needs mental alertness. Tell your doctor or health care professional if this medicine loses its effects, or if you feel you need to take more than the prescribed amount. Do not change the dosage without talking to your doctor or health care professional. Decreased appetite is a common side effect when starting this medicine. Eating small, frequent meals or snacks can help. Talk to your doctor if you continue to have poor eating habits. Height and weight growth of a child taking this medicine will be monitored closely. Do not take this medicine close to bedtime. It may prevent you from sleeping. If you are going to need surgery, a MRI, CT scan, or other procedure, tell your doctor that you are taking this medicine. You may need to stop taking this medicine before the procedure. Tell your doctor or healthcare professional right away if you notice unexplained wounds on your fingers and toes while taking this medicine. You should also tell your healthcare provider if you experience numbness or pain, changes in the skin color, or sensitivity to temperature in your fingers or toes. What side effects may I notice from receiving this medicine? Side effects that you should report to your doctor or health care professional as soon as possible: -allergic reactions like skin rash, itching or hives, swelling of the face, lips, or tongue -changes in vision -chest pain or chest tightness -confusion, trouble speaking or understanding -fast, irregular heartbeat -fingers or toes feel numb, cool, painful -hallucination, loss of contact with reality -high blood pressure -males: prolonged or painful erection -seizures -severe headaches -shortness of breath -suicidal thoughts or other mood changes -trouble walking, dizziness, loss of balance or coordination -uncontrollable head, mouth, neck, arm, or leg  movements Side effects that usually do not require medical attention (report to your doctor or health care professional if they continue or are bothersome): -anxious -headache -loss of appetite -nausea, vomiting -trouble sleeping -weight loss This list may not describe all possible side effects. Call your doctor for medical advice about side effects. You may report side effects to FDA at 1-800-FDA-1088. Where should I keep my medicine? Keep out of the reach of children. This medicine can be abused. Keep your medicine in a safe place to protect it from theft. Do not share this medicine with anyone. Selling or giving away this medicine is dangerous and against the law. Store at room temperature between 15 and 30 degrees C (59 and 86 degrees F). Keep container tightly closed. Throw away any unused medicine after the expiration date. Dispose of properly. This medicine may cause accidental overdose and death if it is taken by other adults, children, or pets. Mix any unused medicine with a substance like cat litter or coffee grounds. Then throw the medicine away in a sealed container like a sealed bag or a coffee can with a lid. Do not use the medicine after the expiration date. NOTE: This sheet is a summary. It may not cover all possible information. If you have questions about this medicine, talk to your doctor, pharmacist, or health care provider.    2016, Elsevier/Gold Standard. (2013-10-28 18:44:41)

## 2015-05-31 NOTE — Progress Notes (Signed)
[facility]   Provider:  Larey Seat, M D  Referring Provider: Idelle Crouch, MD Primary Care Physician:  Idelle Crouch, MD  Chief Complaint  Patient presents with  . Follow-up    cpap is "so-so", rm 10, alone    HPI:  Jamie Murillo is a 65 y.o. female , seen here as a referral from Dr. Doy Hutching .   HX CD- Jamie Murillo has used CPAP since 2011-  the studies I have available here are from 2016 and show that she is  excessively daytime sleepy while  being sufficiently treated on CPAP. The CPAP interpretation showed a sleep time of 376.5 minutes , a sleep latency of 15 minutes, a sleep efficiency of only 81.1% , REM proportion was 28.3% and the AHI was only 5.4 per hour,  the RDI was 6.4 per hour.  During REM sleep AHI  was increased to 10.1 and in supine position 5.4 AHI.  The patient had borderline bradycardia , with an average heart rate was 68. BPM  however. There were no periodic limb movements . She was titrated to 9 cm water pressure she also was tried on 10 cm water pressure and the technologist noted that she seemed to do better.  It was recommended to use 10 cm water pressure CPAP as the most optimal pressure for her. Patient then was required to stay the next day on 01-20-14 for an MS LT interestingly she was given 5 nap opportunities sleep onsets were supposedly within less than a minute in each of her periods and she had REM onset supposedly and 4 out of 5 naps. 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 Patient was treated with CPAP prior to the MS LT , no need to re-titrate her.  Patient goes to bed between 10 and 11 PM, adheres to sleep hygiene rules. She sleeeps supine, because of the CPAP . She was never tried on a nasal pillow.  She sleeps in a cool, quiet, and dark room, uses CPAP every night/  Sometimes she will find her self sleep walking, eating, . She will get up  about 4.30 to help her husband to get ready, sh is retired.  She struggles all day with hypersomnia. She drinks 2 caffeineated sodas. Ice tea. No ETOH , no tobacco use.  She does not take scheduled naps, and had insomnia before. The current degree of sleepiness begun 3- years ago. In the last 18 month she gained 35 pounds!   Interval history from 09-02-14- I still have no access to a baseline sleep study for this patient, and I'm given data from as early as 2016 reflecting a CPAP titration only but no baseline study. Given the difficulties we have to control the patient's excessive daytime sleepiness, she still endorses the Epworth Sleepiness Scale at 22 points and the fatigue severity scale at 61 points, it would have been helpful to at least obtain a download. These data are not available from her machine either. She is using currently a full face mask, AIR fit F , 10 by [employer]. Given her reported history of imipramine allergy she was not able to take Tofranil she stated. So I have no cataplexy controlling medication for this patient. She carries a diagnosis of ADD and the use of stimulants can treat both excessive daytime sleepiness and ADD, but it is not an optimal treatment option for a lady of 65 years  of age. Especially with heart disease  Blood pressure, obesity etc.  Interval history from 11-03-14. Jamie Murillo underwent a sleep study a baseline polysomnography on 10-17-14.patient carries a diagnosis of narcolepsy and always a had been re-titrated to a CPAP machine , but I had no access to at baseline diagnostic sleep study.  For this reason the new sleep study was ordered. The patient's sleep latency was 34 minutes REM latency was 62.5 minutes sleep efficiency was 88.4 her AHI was 11.8 which constitutes mild sleep apnea. Nadir of oxygen was 75% but was only 21 minutes total of desaturation time. She did not retain CO2 above 50 torr, the study revealed that obstructive sleep apnea was still present  and given the patient's very high degree of sleepiness should be treated. I advised to proceed with an hour told titrate her. It was only yesterday October 25 that we get the green light [facility] and they will provide an auto titrate a setting from 5-15 cm pressure window. She can use any interface to her comfort. She has been using full face masks only in  the past. She would like to continue.   Jamie Murillo doesn't drive  Her second sleep disorder narcolepsy had been diagnosed in MSLT  daytime studies. The medication usually used to treat narcolepsy and is related sleepiness bears however several risks for this patient. She has a history of heart disease and she is in stage III chronic kidney disease. We discussed today the use of a stimulant such as Ritalin or Adderall which will make it easier for her to stay awake in daytime. I am not optimistic that it will reduce her Epworth sleepiness score below 10 but it may work temporarily and give her a predictable time window to operate machinery or car. She endorsed today the Epworth score at 22 points and the fatigue severity score at 55 points. The geriatric depression score was endorsed at 2 points. My plan is to have Jamie Murillo use the outflow CPAP and see her after 30 days of using the machine. We will then reobtain an Epworth sleepiness score. In the meantime I will order a very low dose of Ritalin for her, to allow her more wakefulness in daytime. Based on her renal disease and heart condition it is not advisable to treat this patient with Xyrem due to the high sodium load.  Interval history from 05/31/2015 Jamie Murillo is here for a compliance visit on CPAP. She has used the machine 30 out of 30 days but her average sleep time with the machine is only 3 hours and 8 minutes. She assured me that this is all she sleeps. She is using an AutoSet between 5 and 15 cm water with 2 cm EPR. Her residual AHI is only 3.0. The 95th percentile pressure  is 12.6 and the needs to be no adjustments made. I was asked the patient to put the CPAP on an hour before her bedtime if he would fall asleep and that time we have gained an hour of documented sleep time -if not-her compliance record would still benefit The also obtained a new sleepiness score at the patient still endorsed 22 points on her Epworth score 56 on fatigue severity and 4 on geriatric depression.    Review of Systems: Jamie Murillo is here today endorsing the Epworth sleepiness score 22/ 24 and endorsed the fatigue severity score at 55 points. The patient has voluntarily stopped to drive.   Out of a complete  14 system review, the patient complains of only the following symptoms, and all other reviewed systems are negative. Snoring when not using CPAP.  She reports very vivid dreams  (sometimes very real appearing dreams ) . These  wake her up and fragment her sleep.  She experienced sleep paralysis more than once, hypnagogic and hypnopompic hallucinations. Dream intrusions. Screaming in her dreams.  Acting out dreams. When she is in emotionally taxing or upsetting situation , she noticed her knees to buckle. HLA test not needed as MSLT was diagnostic.   The patient reported one cataplexy (anecdotal but she saw her little daughter touching the hot stove top with her hand and in her emotional shock she was not able to go to her daughter )first she screamed out for her husband to get her. So this would be a cataplectic example.  When she was still driving she would chew gum , pinch herself, and keep the car's windows open and the radio on full blast -  all to no avail as she still would fall asleep.   Social History   Social History  . Marital Status: Married    Spouse Name: Philippa Chester  . Number of Children: 3  . Years of Education: 22   Occupational History  . unemployed    Social History Main Topics  . Smoking status: Never Smoker   . Smokeless tobacco: Never Used     Comment: tried  smoking in youth  . Alcohol Use: No     Comment: used when she was 65 years old.   . Drug Use: Yes    Special: LSD, Marijuana     Comment: 07/21/14 "tried in youth"  . Sexual Activity: Yes    Birth Control/ Protection: None   Other Topics Concern  . Not on file   Social History Narrative   Married, lives at home with husband   Caffeine use- 1 soda, 1 tea daily    Current Outpatient Prescriptions  Medication Sig Dispense Refill  . albuterol (PROAIR HFA) 108 (90 BASE) MCG/ACT inhaler Inhale 1-2 puffs into the lungs every 4 (four) hours as needed for wheezing or shortness of breath. 1 Inhaler 3  . albuterol (PROVENTIL) (2.5 MG/3ML) 0.083% nebulizer solution Inhale into the lungs. Reported on 02/03/2015    . allopurinol (ZYLOPRIM) 100 MG tablet Take 200 mg by mouth.     Marland Kitchen ammonium lactate (LAC-HYDRIN) 12 % lotion APPLY TO DAMP SKIN DAILY  1  . clindamycin (CLINDAGEL) 1 % gel CLINDAMYCIN PHOSPHATE, 1% (External Gel) - Historical Medication  (1 %) Active    . Clobetasol Propionate 0.05 % shampoo   0  . EPINEPHrine 0.3 mg/0.3 mL IJ SOAJ injection Inject into the muscle once.    Marland Kitchen estradiol (ESTRACE) 0.5 MG tablet   0  . fluticasone (FLONASE) 50 MCG/ACT nasal spray instill 2 sprays into each nostril NIGHTLY  0  . furosemide (LASIX) 40 MG tablet Take 40 mg by mouth daily.  0  . gabapentin (NEURONTIN) 300 MG capsule Take 600 mg by mouth at bedtime.  0  . levothyroxine (SYNTHROID, LEVOTHROID) 25 MCG tablet Take by mouth.    . losartan (COZAAR) 50 MG tablet Take by mouth.    . metoprolol succinate (TOPROL-XL) 50 MG 24 hr tablet Take 1 tablet (50 mg total) by mouth daily. 90 tablet 3  . montelukast (SINGULAIR) 10 MG tablet   0  . pantoprazole (PROTONIX) 40 MG tablet Take 40 mg by mouth daily.  0  .  pramipexole (MIRAPEX) 1 MG tablet   0  . SYMBICORT 160-4.5 MCG/ACT inhaler inhale 2 puffs by mouth twice a day 10.2 Inhaler 2  . tretinoin (RETIN-A) 0.05 % cream      No current  facility-administered medications for this visit.    Allergies as of 05/31/2015 - Review Complete 05/31/2015  Allergen Reaction Noted  . Bee venom Anaphylaxis 07/08/2014  . Desvenlafaxine Rash, Shortness Of Breath, and Anaphylaxis 07/08/2014  . Diltiazem hcl Anaphylaxis 07/08/2014  . Neomycin Anaphylaxis 07/08/2014  . Other Anaphylaxis 07/08/2014  . Pneumococcal polysaccharide vaccine Dermatitis, Rash, and Swelling 07/08/2014  . Prednisone Anaphylaxis 07/08/2014  . Latex Rash 07/08/2014  . Ampicillin Hives 07/08/2014  . Erythromycin Hives 07/08/2014  . Influenza vaccines  07/08/2014  . Lamotrigine Hives 07/08/2014  . Penicillins Nausea And Vomiting 07/08/2014  . Pneumococcal vaccine Hives 07/08/2014  . Tetracycline Nausea And Vomiting 07/08/2014  . Buspirone Rash 07/08/2014  . Cortisone Rash 07/08/2014  . Imipramine Rash 07/08/2014  . Statins Rash 07/08/2014  . Tape Rash 07/08/2014  . Ziprasidone hcl Rash 07/08/2014    Vitals: BP 120/88 mmHg  Pulse 70  Resp 20  Ht 5' 2"  (1.575 m)  Wt 177 lb (80.287 kg)  BMI 32.37 kg/m2 Last Weight:  Wt Readings from Last 1 Encounters:  05/31/15 177 lb (80.287 kg)       Last Height:   Ht Readings from Last 1 Encounters:  05/31/15 5' 2"  (1.575 m)    Physical exam:  General: The patient is awake, alert and appears not in acute distress. The patient is well groomed. Head: Normocephalic, atraumatic. Neck is supple. Mallampati 3 , she has upper and lower dentures.  neck circumference: 15. Nasal airflow unrestricted, TMJ is evident. Retrognathia is not seen. No goiter .  Cardiovascular:  Regular rate and rhythm, without  murmurs or carotid bruit, and without distended neck veins. Respiratory: Lungs are clear to auscultation. Skin:  Without evidence of edema, or rash Trunk: BMI is elevated and patient  has normal posture.  Neurologic exam : The patient is awake and alert, oriented to place and time.   Memory subjective described as  intact.  There is a normal attention span & concentration ability.  Speech is fluent without  dysarthria, dysphonia or aphasia. Mood and affect are appropriate.  Cranial nerves:  no change in taste or smell. Pupils are equal and briskly reactive to light. Visual fields by finger perimetry are intact. Hearing to finger rub intact.  Facial motor strength is symmetric and tongue and uvula move midline.  Motor exam: Normal tone,muscle bulk and symmetric ,strength in all extremities.  Sensory:  Fine touch, pinprick and vibration were  normal.  Coordination: Rapid alternating movements in the fingers/hands is normal.  Finger-to-nose maneuver normal without evidence of ataxia, dysmetria or tremor.  Gait and station: Patient walks without assistive device and is able unassisted to climb up to the exam table.  Strength within normal limits. Stance is stable and normal.  Deep tendon reflexes: in the  upper and lower extremities are symmetric and intact.    Assessment:  After physical and neurologic examination, review of laboratory studies, imaging, neurophysiology testing and pre-existing records, assessment is   She reported muscle cramping after taking modafinil.  The patient also advised me that she is allergic to imipramine. She developed a rash and a tightness of the throat when she took it the last time.   The patient was advised of the nature of the diagnosed sleep disorder ,  the treatment options and risks for general a health and wellness arising from not treating the condition. Visit duration was 15  minutes.  Over 50% of our face-to-face time today is designated to the confirmation of the diagnosis of follow-up on her chief complaint of hypersomnia and high fatigue and to some degree assisting her managing her benefits through health [employer]. She has been told that she is not entitled to a new machine also versus broken".    She still has hypersomnia, being treated for OSA  from tomorrow on auto -CPAP, AHC 5-15 cm water.  I will ask  FOR COMPLIANCNE DATA in 30 days and 90 days.  I have prescribed Ritalin at a low dose for daytime use.    Plan:  Treatment plan and additional workup :  1) the patient has documented sleep apnea , AHI was 11.4 , she was ordered a auto CPAP 5-15 with a mask of her choice. I also instructed her not to sleep in supine,  but to resume a sleep position on the side - this is difficult with a FFM. Marland Kitchen    2)Given the MSLT results I do think that there is little doubt about her diagnosis of narcolepsy. I will prescribe Ritalin, since    3) In addition Jamie Murillo carries a diagnosis of restless leg syndrome. She has been well treated with pramipexole / Mirapex, she has been on higher doses in the past currently than she is only taking (1 mg at night) down from 1.5 MG before. I am concerned that the restless leg component is actually a primary movement disorderand may be related to either subtherapeutic CPAP use .   4) caries a diagnosis of ADD/ AHD . May benefit from stimulants. This can be addressed with psychiatry-.    Rv in 2 month with NP , Ritalin prescribed through the sleep clinic. I asked the patient to see a psychiatrist, now sees a counselor.    Asencion Partridge Shant Hence MD  05/31/2015

## 2015-06-05 ENCOUNTER — Other Ambulatory Visit: Payer: Self-pay

## 2015-06-05 ENCOUNTER — Emergency Department: Payer: PPO

## 2015-06-05 ENCOUNTER — Encounter: Payer: Self-pay | Admitting: Radiology

## 2015-06-05 ENCOUNTER — Emergency Department
Admission: EM | Admit: 2015-06-05 | Discharge: 2015-06-05 | Disposition: A | Discharge status: Home / Self Care | Payer: PPO | Attending: Emergency Medicine | Admitting: Emergency Medicine

## 2015-06-05 DIAGNOSIS — I252 Old myocardial infarction: Secondary | ICD-10-CM | POA: Insufficient documentation

## 2015-06-05 DIAGNOSIS — M542 Cervicalgia: Secondary | ICD-10-CM | POA: Diagnosis present

## 2015-06-05 DIAGNOSIS — I2510 Atherosclerotic heart disease of native coronary artery without angina pectoris: Secondary | ICD-10-CM | POA: Diagnosis not present

## 2015-06-05 DIAGNOSIS — E1151 Type 2 diabetes mellitus with diabetic peripheral angiopathy without gangrene: Secondary | ICD-10-CM | POA: Diagnosis not present

## 2015-06-05 DIAGNOSIS — Z91040 Latex allergy status: Secondary | ICD-10-CM | POA: Diagnosis not present

## 2015-06-05 DIAGNOSIS — F1290 Cannabis use, unspecified, uncomplicated: Secondary | ICD-10-CM | POA: Insufficient documentation

## 2015-06-05 DIAGNOSIS — I5032 Chronic diastolic (congestive) heart failure: Secondary | ICD-10-CM | POA: Diagnosis not present

## 2015-06-05 DIAGNOSIS — E785 Hyperlipidemia, unspecified: Secondary | ICD-10-CM | POA: Diagnosis not present

## 2015-06-05 DIAGNOSIS — E041 Nontoxic single thyroid nodule: Secondary | ICD-10-CM | POA: Diagnosis not present

## 2015-06-05 DIAGNOSIS — Z85828 Personal history of other malignant neoplasm of skin: Secondary | ICD-10-CM | POA: Diagnosis not present

## 2015-06-05 DIAGNOSIS — F319 Bipolar disorder, unspecified: Secondary | ICD-10-CM | POA: Insufficient documentation

## 2015-06-05 DIAGNOSIS — I130 Hypertensive heart and chronic kidney disease with heart failure and stage 1 through stage 4 chronic kidney disease, or unspecified chronic kidney disease: Secondary | ICD-10-CM | POA: Diagnosis not present

## 2015-06-05 DIAGNOSIS — J449 Chronic obstructive pulmonary disease, unspecified: Secondary | ICD-10-CM | POA: Insufficient documentation

## 2015-06-05 DIAGNOSIS — J45909 Unspecified asthma, uncomplicated: Secondary | ICD-10-CM | POA: Diagnosis not present

## 2015-06-05 DIAGNOSIS — R202 Paresthesia of skin: Secondary | ICD-10-CM | POA: Insufficient documentation

## 2015-06-05 DIAGNOSIS — M1990 Unspecified osteoarthritis, unspecified site: Secondary | ICD-10-CM | POA: Insufficient documentation

## 2015-06-05 DIAGNOSIS — N183 Chronic kidney disease, stage 3 (moderate): Secondary | ICD-10-CM | POA: Diagnosis not present

## 2015-06-05 DIAGNOSIS — E1122 Type 2 diabetes mellitus with diabetic chronic kidney disease: Secondary | ICD-10-CM | POA: Insufficient documentation

## 2015-06-05 DIAGNOSIS — M5136 Other intervertebral disc degeneration, lumbar region: Secondary | ICD-10-CM | POA: Insufficient documentation

## 2015-06-05 DIAGNOSIS — R200 Anesthesia of skin: Secondary | ICD-10-CM | POA: Insufficient documentation

## 2015-06-05 DIAGNOSIS — F909 Attention-deficit hyperactivity disorder, unspecified type: Secondary | ICD-10-CM | POA: Insufficient documentation

## 2015-06-05 DIAGNOSIS — Z79899 Other long term (current) drug therapy: Secondary | ICD-10-CM | POA: Diagnosis not present

## 2015-06-05 LAB — CBC
HEMATOCRIT: 33.1 % — AB (ref 35.0–47.0)
HEMOGLOBIN: 11 g/dL — AB (ref 12.0–16.0)
MCH: 31.4 pg (ref 26.0–34.0)
MCHC: 33.2 g/dL (ref 32.0–36.0)
MCV: 94.4 fL (ref 80.0–100.0)
Platelets: 242 10*3/uL (ref 150–440)
RBC: 3.5 MIL/uL — AB (ref 3.80–5.20)
RDW: 15.6 % — ABNORMAL HIGH (ref 11.5–14.5)
WBC: 7.5 10*3/uL (ref 3.6–11.0)

## 2015-06-05 LAB — DIFFERENTIAL
Basophils Absolute: 0.1 10*3/uL (ref 0–0.1)
Basophils Relative: 1 %
Eosinophils Absolute: 0.4 10*3/uL (ref 0–0.7)
LYMPHS ABS: 1 10*3/uL (ref 1.0–3.6)
Monocytes Absolute: 0.4 10*3/uL (ref 0.2–0.9)
Neutro Abs: 5.6 10*3/uL (ref 1.4–6.5)

## 2015-06-05 LAB — COMPREHENSIVE METABOLIC PANEL
ALBUMIN: 3.8 g/dL (ref 3.5–5.0)
ALK PHOS: 85 U/L (ref 38–126)
ALT: 17 U/L (ref 14–54)
ANION GAP: 6 (ref 5–15)
AST: 19 U/L (ref 15–41)
BILIRUBIN TOTAL: 0.4 mg/dL (ref 0.3–1.2)
BUN: 38 mg/dL — AB (ref 6–20)
CALCIUM: 9.3 mg/dL (ref 8.9–10.3)
CO2: 25 mmol/L (ref 22–32)
Chloride: 110 mmol/L (ref 101–111)
Creatinine, Ser: 1.66 mg/dL — ABNORMAL HIGH (ref 0.44–1.00)
GFR calc Af Amer: 37 mL/min — ABNORMAL LOW (ref >60)
GFR, EST NON AFRICAN AMERICAN: 32 mL/min — AB (ref >60)
GLUCOSE: 161 mg/dL — AB (ref 65–99)
Potassium: 3.8 mmol/L (ref 3.5–5.1)
Sodium: 141 mmol/L (ref 135–145)
TOTAL PROTEIN: 6.8 g/dL (ref 6.5–8.1)

## 2015-06-05 LAB — TROPONIN I

## 2015-06-05 LAB — PROTIME-INR
INR: 0.91
Prothrombin Time: 12.5 seconds (ref 11.4–15.0)

## 2015-06-05 LAB — APTT: aPTT: 25 seconds (ref 24–36)

## 2015-06-05 MED ORDER — SODIUM CHLORIDE 0.9 % IV BOLUS (SEPSIS)
1000.0000 mL | Freq: Once | INTRAVENOUS | Status: AC
  Administered 2015-06-05: 1000 mL via INTRAVENOUS

## 2015-06-05 MED ORDER — MORPHINE SULFATE (PF) 2 MG/ML IV SOLN
2.0000 mg | Freq: Once | INTRAVENOUS | Status: AC
  Administered 2015-06-05: 2 mg via INTRAVENOUS
  Filled 2015-06-05: qty 1

## 2015-06-05 MED ORDER — MORPHINE SULFATE (PF) 4 MG/ML IV SOLN
4.0000 mg | Freq: Once | INTRAVENOUS | Status: AC
Start: 2015-06-05 — End: 2015-06-05
  Administered 2015-06-05: 4 mg via INTRAVENOUS

## 2015-06-05 MED ORDER — IBUPROFEN 400 MG PO TABS
400.0000 mg | ORAL_TABLET | Freq: Once | ORAL | Status: AC
  Administered 2015-06-05: 400 mg via ORAL
  Filled 2015-06-05: qty 18

## 2015-06-05 MED ORDER — CYCLOBENZAPRINE HCL 10 MG PO TABS: 10.0000 mg | ORAL_TABLET | Freq: Three times a day (TID) | ORAL | Status: DC | PRN

## 2015-06-05 MED ORDER — IOPAMIDOL (ISOVUE-370) INJECTION 76%
60.0000 mL | Freq: Once | INTRAVENOUS | Status: AC | PRN
  Administered 2015-06-05: 60 mL via INTRAVENOUS

## 2015-06-05 MED ORDER — HYDROCODONE-ACETAMINOPHEN 5-325 MG PO TABS: 1.0000 | ORAL_TABLET | Freq: Four times a day (QID) | ORAL | Status: DC | PRN

## 2015-06-05 MED ORDER — OXYCODONE-ACETAMINOPHEN 5-325 MG PO TABS
1.0000 | ORAL_TABLET | Freq: Once | ORAL | Status: AC
  Administered 2015-06-05: 1 via ORAL
  Filled 2015-06-05: qty 1

## 2015-06-05 MED ORDER — IBUPROFEN 600 MG PO TABS: 600.0000 mg | ORAL_TABLET | Freq: Three times a day (TID) | ORAL | Status: DC | PRN

## 2015-06-05 MED ORDER — ONDANSETRON HCL 4 MG/2ML IJ SOLN
4.0000 mg | Freq: Once | INTRAMUSCULAR | Status: AC
  Administered 2015-06-05: 4 mg via INTRAVENOUS

## 2015-06-05 MED ORDER — CYCLOBENZAPRINE HCL 10 MG PO TABS
5.0000 mg | ORAL_TABLET | Freq: Once | ORAL | Status: AC
  Administered 2015-06-05: 5 mg via ORAL

## 2015-06-05 NOTE — Discharge Instructions (Signed)
You were evaluated for right-sided neck pain which I think is due to muscle spasm. Take ibuprofen 400 mg every 8 hours as needed at home, and you are also being discharged with as needed pain control called Marko as well as a muscle relaxer.  In terms of the numbness and tingling, I am uncertain what the cause of this might be, but your exam and evaluation emergent MRI today are reassuring. I do believe follow with primary care physician this week, and a neurologist, call for the next available appointment.  Return to the emergency department for any new or worsening condition including any slurred speech, altered mental status, new weakness or numbness, or any worsening pain, chest pain, or trouble breathing.   Musculoskeletal Pain Musculoskeletal pain is muscle and boney aches and pains. These pains can occur in any part of the body. Your caregiver may treat you without knowing the cause of the pain. They may treat you if blood or urine tests, X-rays, and other tests were normal.  CAUSES There is often not a definite cause or reason for these pains. These pains may be caused by a type of germ (virus). The discomfort may also come from overuse. Overuse includes working out too hard when your body is not fit. Boney aches also come from weather changes. Bone is sensitive to atmospheric pressure changes. HOME CARE INSTRUCTIONS   Ask when your test results will be ready. Make sure you get your test results.  Only take over-the-counter or prescription medicines for pain, discomfort, or fever as directed by your caregiver. If you were given medications for your condition, do not drive, operate machinery or power tools, or sign legal documents for 24 hours. Do not drink alcohol. Do not take sleeping pills or other medications that may interfere with treatment.  Continue all activities unless the activities cause more pain. When the pain lessens, slowly resume normal activities. Gradually increase the  intensity and duration of the activities or exercise.  During periods of severe pain, bed rest may be helpful. Lay or sit in any position that is comfortable.  Putting ice on the injured area.  Put ice in a bag.  Place a towel between your skin and the bag.  Leave the ice on for 15 to 20 minutes, 3 to 4 times a day.  Follow up with your caregiver for continued problems and no reason can be found for the pain. If the pain becomes worse or does not go away, it may be necessary to repeat tests or do additional testing. Your caregiver may need to look further for a possible cause. SEEK IMMEDIATE MEDICAL CARE IF:  You have pain that is getting worse and is not relieved by medications.  You develop chest pain that is associated with shortness or breath, sweating, feeling sick to your stomach (nauseous), or throw up (vomit).  Your pain becomes localized to the abdomen.  You develop any new symptoms that seem different or that concern you. MAKE SURE YOU:   Understand these instructions.  Will watch your condition.  Will get help right away if you are not doing well or get worse.   This information is not intended to replace advice given to you by your health care provider. Make sure you discuss any questions you have with your health care provider.   Document Released: 12/25/2004 Document Revised: 03/19/2011 Document Reviewed: 08/29/2012 Elsevier Interactive Patient Education 2016 [employer]  Paresthesia Paresthesia is a burning or prickling feeling. This feeling can  happen in any part of the body. It often happens in the hands, arms, legs, or feet. Usually, it is not painful. In most cases, the feeling goes away in a short time and is not a sign of a serious problem. HOME CARE  Avoid drinking alcohol.  Try massage or needle therapy (acupuncture) to help with your problems.  Keep all follow-up visits as told by your doctor. This is important. GET HELP IF:  You keep on having  episodes of paresthesia.  Your burning or prickling feeling gets worse when you walk.  You have pain or cramps.  You feel dizzy.  You have a rash. GET HELP RIGHT AWAY IF:  You feel weak.  You have trouble walking or moving.  You have problems speaking, understanding, or seeing.  You feel confused.  You cannot control when you pee (urinate) or poop (bowel movement).  You lose feeling (numbness) after an injury.  You pass out (faint).   This information is not intended to replace advice given to you by your health care provider. Make sure you discuss any questions you have with your health care provider.   Document Released: 12/08/2007 Document Revised: 05/11/2014 Document Reviewed: 12/21/2013 Elsevier Interactive Patient Education [employer].

## 2015-06-05 NOTE — ED Provider Notes (Signed)
[facility] Emergency Department Provider Note   ____________________________________________  Time seen: Approximately 11:30 AM I have reviewed the triage vital signs and the triage nursing note.  HISTORY  Chief Complaint Neck Pain; Altered Mental Status; and Dizziness   Historian Patient and husband  HPI Jamie Murillo is a 65 y.o. female with a history of coronary artery disease/MI, renal insufficiency and fibromyalgia, without prior stroke, is here with a complaint of right-sided neck pain. For the past several days the patient has had some mild intermittent right posterior neck discomfort. No traumatic injury. Denies overuse injury. No history of similar. This morning when she woke up it was much more severe and point tender on the lateral area of her neck where she states that she can feel her "heart pounding."  She also woke up feeling a little off balance.  She is unclear whether or not she was having trouble walking because she couldn't move her neck in order to visualize where she was walking, or if it was actually coordination impairment.  No chest discomfort, GI symptoms, nausea, vomiting or diarrhea. She states that she had some chest discomfort yesterday morning but had resolved by noon. She did not seek evaluation yesterday.      Past Medical History  Diagnosis Date  . History of chronic hepatitis     Hepatitis B and C  . Raynaud's disease without gangrene   . Gout   . Secondary hyperparathyroidism, renal ([place])   . Postural orthostatic tachycardia syndrome   . Coronary artery disease with history of myocardial infarction without history of CABG   . Surgical menopause on hormone replacement therapy   . Restless leg syndrome, controlled   . CHF NYHA class I (HCC)     chronic, diastolic  . 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 GERD without esophagitis   .  Hypothyroidism, adult   . Chronic renal impairment, stage 3 (moderate)   . Obstructive sleep apnea, adult     CPAP  . Statin intolerance   . Rosacea   . Patent foramen ovale   . Chronic asthma   . Adult attention deficit disorder   . Bipolar disorder ([place])   . Depression   . Hepatitis B   . Hepatitis C   . Hypertension   . Diabetes mellitus without complication ([place])   . Fibromyalgia   . Cancer ([place])     skin  . Renal insufficiency     2008 per pt  . Anemia   . Dysrhythmia   . COPD (chronic obstructive pulmonary disease) ([place])   . Fibromyalgia   . Gout   . Heart murmur   . Hyperlipidemia   . Raynaud's disease   . Syncope and collapse     Patient Active Problem List   Diagnosis Date Noted  . Excessive sleepiness while driving [id number]  . 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 OSA (obstructive sleep apnea) 11/03/2014  . Attention deficit hyperactivity disorder 11/03/2014  . Major depressive disorder ([place]) 10/12/2014  . Peripheral arterial occlusive disease ([place]) 10/12/2014  . Hypersomnia with sleep apnea 07/21/2014  . 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 Restless legs syndrome (RLS) 07/21/2014  . OSA on CPAP 07/21/2014  . Chronic diastolic heart failure ([place]) 06/16/2014  . Arteriosclerosis of coronary artery 06/16/2014  . Gastro-esophageal reflux disease without esophagitis 06/16/2014  . H/O hepatic disease 06/16/2014  . Arthralgia of multiple joints 06/16/2014  .  Narcolepsy without cataplexy 06/16/2014  . Angiopathy, peripheral ([place]) 06/16/2014  . FO (foramen ovale) 06/16/2014  . Restless leg 06/16/2014  . Paroxysmal digital cyanosis 06/16/2014  . Hyperparathyroidism due to renal insufficiency ([place]) 06/16/2014  . Drug intolerance 06/16/2014  . 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 DDD (degenerative disc disease), lumbar 06/03/2014  . Chronic kidney disease (CKD), stage III (moderate) 04/12/2014  . Degenerative arthritis of spine 04/12/2014  .  Acne erythematosa 04/12/2014  . Other specified health status 04/12/2014  . Adaptive colitis 07/17/2013  . ADD (attention deficit disorder) 10/07/2012  . 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 Fibrositis 05/07/2012  . Pelvic muscle wasting 05/07/2012  . Hemorrhoid 01/18/2012  . Absolute anemia 11/27/2011  . Gout 11/27/2011  . HLD (hyperlipidemia) 11/27/2011  . Adult hypothyroidism 11/27/2011  . Chronic obstructive pulmonary disease ([place]) 11/27/2011  . Female genuine stress incontinence 06/15/2011  . Enterocele 06/15/2011  . Acquired hammer toe 05/24/2011  . Bunion 10/31/2010  . Aortic heart valve narrowing 10/02/2010  . Airway hyperreactivity 10/02/2010  . 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 BP (high blood pressure) 10/02/2010  . Obstructive apnea 10/02/2010  . Episode of syncope 10/02/2010  . 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 Myocardial infarction ([place]) 10/02/2010  . Type 2 diabetes mellitus ([place]) 10/02/2010  . ASD (atrial septal defect), ostium secundum 06/29/2006  . Diabetes ([place]) 06/29/2006    Past Surgical History  Procedure Laterality Date  . Foot surgery Bilateral   . Breast surgery    . Pelvic floor repair    . Abdominal hysterectomy    . Bilateral tubal ligation    . Bladder surgery      "repair"  . Bunionectomy Bilateral   . Breast biopsy Right     negative 3 different biopsies  . Colonoscopy N/A 12/06/2014    Procedure: COLONOSCOPY;  Surgeon: Manya Silvas, MD;  Location: [facility]COPY;  Service: Endoscopy;  Laterality: N/A;  . Cardiac catheterization    . Tubal ligation    . Appendectomy    . Esophagogastroduodenoscopy (egd) with propofol N/A 02/14/2015    Procedure: ESOPHAGOGASTRODUODENOSCOPY (EGD) WITH PROPOFOL;  Surgeon: Manya Silvas, MD;  Location: [facility];  Service: Endoscopy;  Laterality: N/A;    Current Outpatient Rx  Name  Route  Sig  Dispense  Refill  . albuterol (PROAIR HFA) 108 (90 BASE) MCG/ACT inhaler    Inhalation   Inhale 1-2 puffs into the lungs every 4 (four) hours as needed for wheezing or shortness of breath.   1 Inhaler   3   . albuterol (PROVENTIL) (2.5 MG/3ML) 0.083% nebulizer solution   Inhalation   Inhale into the lungs. Reported on 02/03/2015         . allopurinol (ZYLOPRIM) 100 MG tablet   Oral   Take 200 mg by mouth.          Marland Kitchen ammonium lactate (LAC-HYDRIN) 12 % lotion      APPLY TO DAMP SKIN DAILY      1   . amphetamine-dextroamphetamine (ADDERALL XR) 30 MG 24 hr capsule   Oral   Take 1 capsule (30 mg total) by mouth daily.   30 capsule   0   . clindamycin (CLINDAGEL) 1 % gel      CLINDAMYCIN PHOSPHATE, 1% (External Gel) - Historical Medication  (1 %) Active         . Clobetasol Propionate 0.05 % shampoo            0   .  EPINEPHrine 0.3 mg/0.3 mL IJ SOAJ injection   Intramuscular   Inject into the muscle once.         Marland Kitchen estradiol (ESTRACE) 0.5 MG tablet            0   . fluticasone (FLONASE) 50 MCG/ACT nasal spray      instill 2 sprays into each nostril NIGHTLY      0   . furosemide (LASIX) 40 MG tablet   Oral   Take 40 mg by mouth daily.      0   . gabapentin (NEURONTIN) 300 MG capsule   Oral   Take 600 mg by mouth at bedtime.      0   . levothyroxine (SYNTHROID, LEVOTHROID) 25 MCG tablet   Oral   Take by mouth.         . losartan (COZAAR) 50 MG tablet   Oral   Take by mouth.         . metoprolol succinate (TOPROL-XL) 50 MG 24 hr tablet   Oral   Take 1 tablet (50 mg total) by mouth daily.   90 tablet   3   . montelukast (SINGULAIR) 10 MG tablet            0   . pantoprazole (PROTONIX) 40 MG tablet   Oral   Take 40 mg by mouth daily.      0   . pramipexole (MIRAPEX) 1 MG tablet            0   . SYMBICORT 160-4.5 MCG/ACT inhaler      inhale 2 puffs by mouth twice a day   10.2 Inhaler   2     Manufacturer Coupon Available.   . tretinoin (RETIN-A) 0.05 % cream                  Allergies Bee venom; Desvenlafaxine; Diltiazem hcl; Neomycin; Other; Pneumococcal polysaccharide vaccine; Prednisone; Latex; Ampicillin; Erythromycin; Influenza vaccines; Lamotrigine; Penicillins; Pneumococcal vaccine; Tetracycline; Buspirone; Cortisone; Imipramine; Statins; Tape; and Ziprasidone hcl  Family History  Problem Relation Age of Onset  . Hypertension Mother   . Hypothyroidism Mother   . Heart disease Mother   . Lung cancer Mother   . Alcohol abuse Mother   . Depression Mother   . Hypertension Father   . Heart disease Father   . Alcohol abuse Father   . Hypothyroidism Sister   . Hypertension Sister   . Bipolar disorder Sister   . Heart disease Brother   . Alcohol abuse Brother   . Bipolar disorder Brother   . Hypothyroidism Sister   . Hypertension Sister   . Breast cancer Neg Hx     Social History Social History  Substance Use Topics  . Smoking status: Never Smoker   . Smokeless tobacco: Never Used     Comment: tried smoking in youth  . Alcohol Use: No     Comment: used when she was 65 years old.     Review of Systems  Constitutional: Negative for fever. Eyes: Negative for visual changes. ENT: Negative for sore throat. Cardiovascular: Negative for Any current chest pain. Respiratory: Negative for shortness of breath. Gastrointestinal: Negative for abdominal pain, vomiting and diarrhea. Genitourinary: Negative for dysuria. Musculoskeletal: Negative for back pain. Skin: Negative for rash. Neurological: Negative for headache. 10 point Review of Systems otherwise negative ____________________________________________   PHYSICAL EXAM:  VITAL SIGNS: ED Triage Vitals  Enc Vitals Group     BP 06/05/15  0957 129/59 mmHg     Pulse Rate 06/05/15 0957 83     Resp 06/05/15 0957 18     Temp 06/05/15 0957 98.2 F (36.8 C)     Temp Source 06/05/15 0957 Oral     SpO2 06/05/15 0957 98 %     Weight 06/05/15 0957 170 lb (77.111 kg)     Height 06/05/15 0957  5\' 2"  (1.575 m)     Head Cir --      Peak Flow --      Pain Score 06/05/15 0958 10     Pain Loc --      Pain Edu? --      Excl. in [place]? --      Constitutional: Alert and oriented. Well appearing and in no distress. HEENT   Head: Normocephalic and atraumatic.      Eyes: Conjunctivae are normal. PERRL. Normal extraocular movements.      Ears:         Nose: No congestion/rhinnorhea.   Mouth/Throat: Mucous membranes are moist.   Neck: No stridor.  Neck point tender along the trapezius at the insertion at the shoulder, as well as the spine. No bruit appreciated. Skin rash. No point tenderness of the cervical spinous processes. Cardiovascular/Chest: Normal rate, regular rhythm.  No murmurs, rubs, or gallops. Respiratory: Normal respiratory effort without tachypnea nor retractions. Breath sounds are clear and equal bilaterally. No wheezes/rales/rhonchi. Gastrointestinal: Soft. No distention, no guarding, no rebound. Nontender.    Genitourinary/rectal:Deferred Musculoskeletal: Nontender with normal range of motion in all extremities. No joint effusions.  No lower extremity tenderness.  No edema. Neurologic:  No slurred speech. No confusion, receptive or expressive aphasia. No facial droop. Tongue protrusion normal. Reports paresthesia to the left face, left arm and left leg. 5 out of 5 strength in 4 extremities. I did not test the patient's gait, but her finger to nose is intact bilaterally.  Skin:  Skin is warm, dry and intact. No rash noted. Psychiatric: Mood and affect are normal. Speech and behavior are normal. Patient exhibits appropriate insight and judgment.  ____________________________________________   EKG I, Lisa Roca, MD, the attending physician have personally viewed and interpreted all ECGs.  80 bpm. Normal sinus rhythm. Narrow QRS. normal axis. Nonspecific T-wave ____________________________________________  LABS (pertinent positives/negatives)  Labs Reviewed   CBC - Abnormal; Notable for the following:    RBC 3.50 (*)    Hemoglobin 11.0 (*)    HCT 33.1 (*)    RDW 15.6 (*)    All other components within normal limits  COMPREHENSIVE METABOLIC PANEL - Abnormal; Notable for the following:    Glucose, Bld 161 (*)    BUN 38 (*)    Creatinine, Ser 1.66 (*)    GFR calc non Af Amer 32 (*)    GFR calc Af Amer 37 (*)    All other components within normal limits  PROTIME-INR  APTT  DIFFERENTIAL  TROPONIN I  CBG MONITORING, ED    ____________________________________________  RADIOLOGY All Xrays were viewed by me. Imaging interpreted by Radiologist.  CT head noncontrast: Negative head CT  CT angiogram neck:  CLINICAL DATA: 65 year old hypertensive diabetic 65 year old female with dizziness and neck pain with some difficulty speaking. Subsequent encounter.  EXAM: CT ANGIOGRAPHY NECK  TECHNIQUE: Multidetector CT imaging of the neck was performed using the standard protocol during bolus administration of intravenous contrast. Multiplanar CT image reconstructions and MIPs were obtained to evaluate the vascular anatomy. Carotid stenosis measurements (when applicable) are  obtained utilizing NASCET criteria, using the distal internal carotid diameter as the denominator.  CONTRAST: 60 cc Isovue.  COMPARISON: 05/28/2015 head CT.  FINDINGS: Aortic arch: 3 vessel arch. Ductus bump incidentally noted.  Right carotid system: No significant narrowing or regularity.  Left carotid system: Ectasia with 2 kinks of the common carotid artery with mild narrowing. No significant stenosis left carotid bifurcation. Slight ectasia left internal carotid artery just proximal to the skullbase.  Vertebral arteries:Proximal vertebral artery ectasia greater on left with mild folds and mild narrowing otherwise vertebral artery unremarkable.  Skeleton: C3-4 bulge greater to left with mild cord contact. C5-6 and C6-7 broad-based disc osteophyte  complex with spinal stenosis and cord flattening greater on the right. Bilateral foraminal narrowing greater on the right. Minimal anterior slip C7 with mild bulge.  Other neck: Nodularity thyroid gland largest inferior aspect right lobe measuring up to 1.8 cm. Elective thyroid ultrasound recommended for further delineation.  No worrisome lung apical lesion.  IMPRESSION: No significant narrowing or regularity of the right carotid system.  Ectasia with 2 kinks of the left common carotid artery with mild narrowing. No significant stenosis left carotid bifurcation. Slight ectasia left internal carotid artery just proximal to the skullbase.  Proximal vertebral artery ectasia greater on left with mild folds and mild narrowing otherwise vertebral artery unremarkable.  C3-4 bulge greater to left with mild cord contact. C5-6 and C6-7 broad-based disc osteophyte complex with spinal stenosis and cord flattening greater on the right. Bilateral foraminal narrowing greater on the right. Minimal anterior slip C7 with mild bulge.  Nodularity thyroid gland largest inferior aspect right lobe measuring up to 1.8 cm. Elective thyroid ultrasound recommended for further delineation.   MRI brain without contrast:  Negative  MRI cervical spine without contrast:  IMPRESSION: Moderate spondylosis at C5-6 with mild spinal stenosis and moderate foraminal stenosis bilaterally  Mild spondylosis at C6-7  Small left-sided disc protrusion C7-T1. __________________________________________  PROCEDURES   Procedure(s) performed: None  Critical Care performed: None  ____________________________________________   ED COURSE / ASSESSMENT AND PLAN  Pertinent labs & imaging results that were available during my care of the patient were reviewed by me and considered in my medical decision making (see chart for details).   This patient arrived with some complaints the triage nurse felt concern for  neurologic emergency such as stroke and initiated head CT noncontrast.  On my exam, although the triage nurse noted possibility of facial droop, the family does not report facial droop, and she has equal ability to utilize facial muscles. She is however complaining of some paresthesias on the left side of her body. She I don't think noticed this at home, just upon testing. Her main complaint is the right-sided neck pain which may been ongoing over a couple days, but which is much more severe and worse with movement of her neck today.  Clinically seems like this is probably trapezius muscle spasm as its point tender along this muscle. However I will go ahead and rule out vascular emergency with angiogram of the neck.  She is not a candidate for TPA or code stroke initiation based on last no normal would've been at least last night. Her only neurologic findings on exam are at the mild paresthesias of the left face arm and leg.  I plan to MRI to rule out acute stroke after ruling out carotid dissection.  We'll go ahead and treat symptomatically for pain and muscle spasm relief.  After 4 mg of morphine, patient  reported pain down from 8 out of 10-6 out of 10. I will give her second dose of morphine.   I did discuss the patient's MRI of the brain and cervical spine are reassuring for no emergency condition including no stroke or cord impingement.  Again at this point in time emergency conditions have been evaluated and ruled out. I think that her acute pain is actually due to muscle strain/spasm.  In terms of the paresthesias, she did tell the nurse initially there was a right sided, and for me is left side, but so long as the MRI has not shown acute stroke, I think it safe for her to discharge home now.   CONSULTATIONS:   None   Patient / Family / Caregiver informed of clinical course, medical decision-making process, and agree with plan.   I discussed return precautions, follow-up instructions,  and discharged instructions with patient and/or family.   ___________________________________________   FINAL CLINICAL IMPRESSION(S) / ED DIAGNOSES   Final diagnoses:  Neck pain on right side  Numbness and tingling of left arm and leg  Numbness and tingling of left side of face  Thyroid nodule  Left sided numbness              Note: This dictation was prepared with Dragon dictation. Any transcriptional errors that result from this process are unintentional   Lisa Roca, MD 06/05/15 1816

## 2015-06-05 NOTE — ED Notes (Signed)
Patient presents to the ED with dizziness, neck pain, and some difficulty speaking.  Patient seems to be leaning slightly to the right side in triage but is able to straighten up when asked.  Patient reports decreased sensation to the right side of her face.  Patient's grip strengths are strong and equal.  Smile does not appear even, right sided droop.

## 2015-06-07 ENCOUNTER — Other Ambulatory Visit: Payer: Self-pay

## 2015-06-08 NOTE — Telephone Encounter (Signed)
Patient has a new primary care provider listed, Dr. Fulton Reek; please confirm with her if she has switched practices, and if so, please have her request medicines from new provider; thank you If she still considers [place] her medical home, she'll need an appointment; no office visits here in the chart since June 09, 2014

## 2015-06-09 ENCOUNTER — Encounter: Payer: Self-pay | Admitting: Neurology

## 2015-06-09 ENCOUNTER — Telehealth: Payer: Self-pay

## 2015-06-09 NOTE — Telephone Encounter (Signed)
I completed pa for adderall, sent to [facility]. Should have determination in 3-5 business days.

## 2015-06-13 NOTE — Telephone Encounter (Signed)
PA for adderall was approved by [facility]. 06/10/2015 until 01/08/2016.  I called pt to inform her, no answer, left a message asking her to call me back.

## 2015-07-05 ENCOUNTER — Other Ambulatory Visit: Payer: Self-pay | Admitting: Neurology

## 2015-07-05 DIAGNOSIS — G47411 Narcolepsy with cataplexy: Secondary | ICD-10-CM

## 2015-07-05 MED ORDER — AMPHETAMINE-DEXTROAMPHET ER 30 MG PO CP24: 30.0000 mg | ORAL_CAPSULE | Freq: Every day | ORAL | Status: DC

## 2015-07-05 NOTE — Telephone Encounter (Signed)
Patient called to request refill of amphetamine-dextroamphetamine (ADDERALL XR) 30 MG 24 hr capsule

## 2015-07-05 NOTE — Telephone Encounter (Signed)
Dr. Brett Fairy patient, Rx due and up to date on appts.

## 2015-07-05 NOTE — Telephone Encounter (Signed)
Ok to fill 

## 2015-07-05 NOTE — Telephone Encounter (Signed)
Called patient and let her know that her Adderall Rx is ready for p/u

## 2015-07-05 NOTE — Telephone Encounter (Signed)
Rx printed and put on [employer] for signature.

## 2015-07-07 ENCOUNTER — Encounter: Payer: Self-pay | Admitting: Neurology

## 2015-07-15 ENCOUNTER — Ambulatory Visit: Payer: PPO

## 2015-08-09 ENCOUNTER — Other Ambulatory Visit: Payer: Self-pay | Admitting: Diagnostic Neuroimaging

## 2015-08-09 DIAGNOSIS — G47411 Narcolepsy with cataplexy: Secondary | ICD-10-CM

## 2015-08-10 ENCOUNTER — Encounter: Payer: Self-pay | Admitting: Neurology

## 2015-08-10 ENCOUNTER — Telehealth: Payer: Self-pay

## 2015-08-10 MED ORDER — AMPHETAMINE-DEXTROAMPHET ER 30 MG PO CP24: 30.0000 mg | ORAL_CAPSULE | Freq: Every day | ORAL | 0 refills | Status: DC

## 2015-08-10 MED ORDER — AMPHETAMINE-DEXTROAMPHETAMINE 10 MG PO TABS: 10.0000 mg | ORAL_TABLET | Freq: Two times a day (BID) | ORAL | 0 refills | Status: DC

## 2015-08-10 NOTE — Telephone Encounter (Signed)
I responded to pt's mychart message. Please see that documentation.

## 2015-08-10 NOTE — Telephone Encounter (Deleted)
Pt sent a mychart message requesting an increase in adderall, if possible and a refill. She is currently taking adderall XR 30mg  daily. Do you want to increase her dosage?

## 2015-08-10 NOTE — Telephone Encounter (Signed)
Pt sent a mychart message requesting an increase in adderall, if possible and a refill. She is currently taking adderall XR 30mg  daily. Do you want to increase her dosage?

## 2015-08-11 ENCOUNTER — Encounter: Payer: Self-pay | Admitting: Neurology

## 2015-08-12 NOTE — Telephone Encounter (Signed)
My nurse, Lester Morada, will handle this on Monday or Tuesday if we have received the papers by that day.  No worry, that price difference is certainly worth it !   CD

## 2015-08-16 NOTE — Telephone Encounter (Signed)
Dear Cyril Mourning, did you suspect Dr. Leta Baptist , MD has these forms now? Can we  forward this to his nurse ? CD .

## 2015-08-16 NOTE — Telephone Encounter (Signed)
I spoke to [facility] today and they will send me the correct forms to fill out.

## 2015-08-18 ENCOUNTER — Telehealth: Payer: Self-pay | Admitting: Neurology

## 2015-08-18 NOTE — Telephone Encounter (Signed)
Cassandra with [employer] is calling to get prior authorization for medication  amphetamine-dextroamphetamine (ADDERALL XR) 30 MG 24 hr capsule for the patient.

## 2015-08-18 NOTE — Telephone Encounter (Signed)
I have already completed this pa twice. I got a fax back this morning from the pa I did on this yesterday saying that an approval for this medication is already on file and does not expire until 01/08/2016.  I returned Cassandra's call, no answer, left her a message to call me back.

## 2015-08-18 NOTE — Telephone Encounter (Signed)
I spoke to [place] at [facility]. She reports that the tier exception for brand adderall 30 XR will not be approved. The only way to make that med cheaper is to do a pa for generic adderall 30 XR. Also, a pa for generic adderall 10 mg is not needed. It will be about $15/month and that is as cheap as they can get it.  I called pt and explained all of this to her. She asked me to complete a pa for generic adderall 30 XR and will pick up her adderall 10mg .

## 2015-08-22 NOTE — Telephone Encounter (Signed)
PA for generic adderal XR 30mg  completed, sent to [facility].

## 2015-08-22 NOTE — Telephone Encounter (Signed)
I spoke to [facility] and completed the pa for amphetamine dextroamphetamine ER 30 mg. Should have a determination in 1-3 days.

## 2015-08-22 NOTE — Telephone Encounter (Signed)
Cassandra/[facility] called to let nurse know computer issues are fixed to call [phone] .

## 2015-08-22 NOTE — Telephone Encounter (Signed)
Received determination back-says there is an existing approval on file for this medication.. I called Cassandra at [facility]. She says this is incorrect and we could do a pa over the phone. However, their computers were done. She will give me a call back as soon as she can to complete the pa over the phone.

## 2015-08-23 NOTE — Telephone Encounter (Signed)
I spoke to [facility]. I completed this PA for the 4th time. Should receive a determination in 3-5 days.

## 2015-08-23 NOTE — Telephone Encounter (Signed)
Leo/[facility] called and says they lost the questions for the PA. He would like to know if the questions can be answered again. I skyed nurse who took call.

## 2015-08-29 NOTE — Telephone Encounter (Signed)
Received notification that [employer] approved coverage of amphetamine-dextroampheetamine30 mg ER capsule was approved from 08/24/2015 to 01/08/2016.  I alerted pt via mychart message.

## 2015-09-26 ENCOUNTER — Ambulatory Visit
Admission: RE | Admit: 2015-09-26 | Discharge: 2015-09-26 | Disposition: A | Discharge status: Home / Self Care | Payer: PPO | Source: Ambulatory Visit | Attending: Otolaryngology | Admitting: Otolaryngology

## 2015-09-26 ENCOUNTER — Other Ambulatory Visit: Payer: Self-pay | Admitting: Otolaryngology

## 2015-09-26 DIAGNOSIS — J3489 Other specified disorders of nose and nasal sinuses: Secondary | ICD-10-CM | POA: Diagnosis not present

## 2015-09-29 ENCOUNTER — Other Ambulatory Visit: Payer: Self-pay | Admitting: Neurology

## 2015-09-29 ENCOUNTER — Encounter: Payer: Self-pay | Admitting: *Deleted

## 2015-09-29 ENCOUNTER — Telehealth: Payer: Self-pay | Admitting: *Deleted

## 2015-09-29 MED ORDER — AMPHETAMINE-DEXTROAMPHETAMINE 10 MG PO TABS: 10.0000 mg | ORAL_TABLET | Freq: Two times a day (BID) | ORAL | 0 refills | Status: DC

## 2015-09-29 MED ORDER — AMPHETAMINE-DEXTROAMPHET ER 30 MG PO CP24: 30.0000 mg | ORAL_CAPSULE | Freq: Every day | ORAL | 0 refills | Status: DC

## 2015-09-29 NOTE — Telephone Encounter (Signed)
Adderall refill request 

## 2015-09-29 NOTE — Telephone Encounter (Signed)
Prescriptions printed, signed and placed up front for pick up.  Patient aware.

## 2015-10-17 ENCOUNTER — Other Ambulatory Visit: Payer: Self-pay | Admitting: Internal Medicine

## 2015-10-17 DIAGNOSIS — Z1231 Encounter for screening mammogram for malignant neoplasm of breast: Secondary | ICD-10-CM

## 2015-11-14 ENCOUNTER — Other Ambulatory Visit: Payer: Self-pay | Admitting: Neurology

## 2015-11-14 ENCOUNTER — Ambulatory Visit
Admission: RE | Admit: 2015-11-14 | Discharge: 2015-11-14 | Disposition: A | Discharge status: Home / Self Care | Payer: PPO | Source: Ambulatory Visit | Attending: Internal Medicine | Admitting: Internal Medicine

## 2015-11-14 DIAGNOSIS — Z1231 Encounter for screening mammogram for malignant neoplasm of breast: Secondary | ICD-10-CM

## 2015-11-14 MED ORDER — AMPHETAMINE-DEXTROAMPHETAMINE 10 MG PO TABS: 10.0000 mg | ORAL_TABLET | Freq: Two times a day (BID) | ORAL | 0 refills | Status: DC

## 2015-11-14 MED ORDER — AMPHETAMINE-DEXTROAMPHET ER 30 MG PO CP24: 30.0000 mg | ORAL_CAPSULE | Freq: Every day | ORAL | 0 refills | Status: DC

## 2015-11-17 ENCOUNTER — Other Ambulatory Visit: Payer: Self-pay | Admitting: Internal Medicine

## 2015-11-17 DIAGNOSIS — N6489 Other specified disorders of breast: Secondary | ICD-10-CM

## 2015-12-05 ENCOUNTER — Telehealth: Payer: Self-pay | Admitting: Neurology

## 2015-12-05 NOTE — Telephone Encounter (Signed)
Called left messages on home and cell that appt cancelled due to emergency.

## 2015-12-06 ENCOUNTER — Ambulatory Visit
Admission: RE | Admit: 2015-12-06 | Discharge: 2015-12-06 | Disposition: A | Discharge status: Home / Self Care | Payer: PPO | Source: Ambulatory Visit | Attending: Internal Medicine | Admitting: Internal Medicine

## 2015-12-06 ENCOUNTER — Ambulatory Visit: Payer: PPO | Admitting: Adult Health

## 2015-12-06 DIAGNOSIS — N6489 Other specified disorders of breast: Secondary | ICD-10-CM | POA: Diagnosis not present

## 2015-12-14 ENCOUNTER — Telehealth: Payer: Self-pay

## 2015-12-14 ENCOUNTER — Encounter: Payer: Self-pay | Admitting: Adult Health

## 2015-12-14 ENCOUNTER — Ambulatory Visit: Payer: Self-pay | Admitting: Adult Health

## 2015-12-14 NOTE — Telephone Encounter (Signed)
Pt did not show for their appt with Megan, NP today.  

## 2015-12-19 ENCOUNTER — Telehealth: Payer: Self-pay

## 2015-12-19 NOTE — Telephone Encounter (Signed)
I called pt. She is agreeable to a 01/17/16 at 8:30am appt with Jinny Blossom, NP. Pt verbalized understanding of appt date and time.

## 2015-12-19 NOTE — Telephone Encounter (Signed)
LMVM for pt to return call about appt (12/18 at 1530 not available on MM schedule).  Needing her to return call to schedule and phone operators may do this for her.

## 2015-12-19 NOTE — Telephone Encounter (Signed)
I called pt to discuss rescheduling pt with Jamie Blossom, NP per a mychart conversation.  If pt calls back, please get her scheduled with Jamie Blossom, NP.

## 2016-01-14 ENCOUNTER — Emergency Department
Admission: EM | Admit: 2016-01-14 | Discharge: 2016-01-14 | Disposition: A | Discharge status: Home / Self Care | Payer: Medicare HMO | Attending: Emergency Medicine | Admitting: Emergency Medicine

## 2016-01-14 ENCOUNTER — Encounter: Payer: Self-pay | Admitting: Emergency Medicine

## 2016-01-14 DIAGNOSIS — I5032 Chronic diastolic (congestive) heart failure: Secondary | ICD-10-CM | POA: Insufficient documentation

## 2016-01-14 DIAGNOSIS — J45909 Unspecified asthma, uncomplicated: Secondary | ICD-10-CM | POA: Insufficient documentation

## 2016-01-14 DIAGNOSIS — Z79899 Other long term (current) drug therapy: Secondary | ICD-10-CM | POA: Insufficient documentation

## 2016-01-14 DIAGNOSIS — E039 Hypothyroidism, unspecified: Secondary | ICD-10-CM | POA: Insufficient documentation

## 2016-01-14 DIAGNOSIS — E1122 Type 2 diabetes mellitus with diabetic chronic kidney disease: Secondary | ICD-10-CM | POA: Insufficient documentation

## 2016-01-14 DIAGNOSIS — J449 Chronic obstructive pulmonary disease, unspecified: Secondary | ICD-10-CM | POA: Diagnosis not present

## 2016-01-14 DIAGNOSIS — T380X5A Adverse effect of glucocorticoids and synthetic analogues, initial encounter: Secondary | ICD-10-CM | POA: Insufficient documentation

## 2016-01-14 DIAGNOSIS — L233 Allergic contact dermatitis due to drugs in contact with skin: Secondary | ICD-10-CM | POA: Diagnosis not present

## 2016-01-14 DIAGNOSIS — I2510 Atherosclerotic heart disease of native coronary artery without angina pectoris: Secondary | ICD-10-CM | POA: Insufficient documentation

## 2016-01-14 DIAGNOSIS — N183 Chronic kidney disease, stage 3 (moderate): Secondary | ICD-10-CM | POA: Diagnosis not present

## 2016-01-14 DIAGNOSIS — I252 Old myocardial infarction: Secondary | ICD-10-CM | POA: Diagnosis not present

## 2016-01-14 DIAGNOSIS — I130 Hypertensive heart and chronic kidney disease with heart failure and stage 1 through stage 4 chronic kidney disease, or unspecified chronic kidney disease: Secondary | ICD-10-CM | POA: Insufficient documentation

## 2016-01-14 DIAGNOSIS — L299 Pruritus, unspecified: Secondary | ICD-10-CM | POA: Diagnosis present

## 2016-01-14 MED ORDER — CYPROHEPTADINE HCL 4 MG PO TABS: 4.0000 mg | ORAL_TABLET | Freq: Three times a day (TID) | ORAL | 0 refills | Status: DC | PRN

## 2016-01-14 MED ORDER — CYPROHEPTADINE HCL 4 MG PO TABS
4.0000 mg | ORAL_TABLET | Freq: Once | ORAL | Status: AC
  Administered 2016-01-14: 4 mg via ORAL
  Filled 2016-01-14: qty 1

## 2016-01-14 MED ORDER — DEXAMETHASONE 4 MG PO TABS: 4.0000 mg | ORAL_TABLET | Freq: Two times a day (BID) | ORAL | 0 refills | Status: DC

## 2016-01-14 MED ORDER — FAMOTIDINE 20 MG PO TABS
40.0000 mg | ORAL_TABLET | Freq: Once | ORAL | Status: AC
  Administered 2016-01-14: 40 mg via ORAL
  Filled 2016-01-14: qty 2

## 2016-01-14 MED ORDER — RANITIDINE HCL 150 MG PO TABS: 150.0000 mg | ORAL_TABLET | Freq: Two times a day (BID) | ORAL | 0 refills | Status: DC

## 2016-01-14 MED ORDER — DEXAMETHASONE SODIUM PHOSPHATE 10 MG/ML IJ SOLN
10.0000 mg | Freq: Once | INTRAMUSCULAR | Status: AC
  Administered 2016-01-14: 10 mg via INTRAMUSCULAR
  Filled 2016-01-14: qty 1

## 2016-01-14 NOTE — ED Triage Notes (Signed)
Itching face and eyes x 2 days, states began when put hydrocortisone cream on face.

## 2016-01-14 NOTE — ED Provider Notes (Signed)
[facility] Emergency Department Provider Note ____________________________________________  Time seen: 1440  I have reviewed the triage vital signs and the nursing notes.  HISTORY  Chief Complaint  Pruritis  HPI Jamie Murillo is a 66 y.o. female with multiple medication allergies, presents to the ED for evaluation of local skin irritation after she applied some over-the-counter hydrocortisone cream to her face. The patient didn't realize at the time that the steroid cream was the same as her confirmed allergy. She left by the medication 2 days ago, but since that time as had redness,irritation, itching, and scaling to the skin on her cheeks and her upper lids. She denies any difficulty breathing, swallowing, or controlling oral secretions.  The patient notes she can dose decadron/dexamethasone without sensitivity.   Past Medical History:  Diagnosis Date  . Adult attention deficit disorder   . Anemia   . Bipolar disorder ([place])   . Cancer ([place])    skin  . CHF NYHA class I (HCC)    chronic, diastolic  . Chronic asthma   . Chronic renal impairment, stage 3 (moderate)   . COPD (chronic obstructive pulmonary disease) ([place])   . Coronary artery disease with history of myocardial infarction without history of CABG   . Depression   . Diabetes mellitus without complication ([place])   . Dysrhythmia   . Fibromyalgia   . Fibromyalgia   . GERD without esophagitis   . Gout   . Gout   . Heart murmur   . Hepatitis B   . Hepatitis C   . History of chronic hepatitis    Hepatitis B and C  . Hyperlipidemia   . Hypertension   . Hypothyroidism, adult   . 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 Obstructive sleep apnea, adult    CPAP  . 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 Patent foramen ovale   . Postural orthostatic tachycardia syndrome   . Raynaud's disease   . Raynaud's disease without gangrene   . Renal  insufficiency    2008 per pt  . Restless leg syndrome, controlled   . Rosacea   . Secondary hyperparathyroidism, renal ([place])   . Statin intolerance   . Surgical menopause on hormone replacement therapy   . Syncope and collapse     Patient Active Problem List   Diagnosis Date Noted  . Excessive sleepiness while driving [id number]  . 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 OSA (obstructive sleep apnea) 11/03/2014  . Attention deficit hyperactivity disorder 11/03/2014  . Major depressive disorder 10/12/2014  . 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 Hypersomnia with sleep apnea 07/21/2014  . 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 Restless legs syndrome (RLS) 07/21/2014  . OSA on CPAP 07/21/2014  . Chronic diastolic heart failure ([place]) 06/16/2014  . Arteriosclerosis of coronary artery 06/16/2014  . Gastro-esophageal reflux disease without esophagitis 06/16/2014  . H/O hepatic disease 06/16/2014  . Arthralgia of multiple joints 06/16/2014  . 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 Angiopathy, peripheral ([place]) 06/16/2014  . FO (foramen ovale) 06/16/2014  . Restless leg 06/16/2014  . Paroxysmal digital cyanosis 06/16/2014  . Hyperparathyroidism due to renal insufficiency ([place]) 06/16/2014  . Drug intolerance 06/16/2014  . 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 DDD (degenerative disc disease), lumbar 06/03/2014  . Chronic kidney disease (CKD), stage III (moderate) 04/12/2014  . Degenerative arthritis of spine 04/12/2014  . Acne erythematosa 04/12/2014  . Other specified health status 04/12/2014  .  Adaptive colitis 07/17/2013  . ADD (attention deficit disorder) 10/07/2012  . 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 Fibrositis 05/07/2012  . Pelvic muscle wasting 05/07/2012  . Hemorrhoid 01/18/2012  . Absolute anemia 11/27/2011  . Gout 11/27/2011  . HLD (hyperlipidemia) 11/27/2011  . Adult hypothyroidism 11/27/2011  . Chronic obstructive  pulmonary disease ([place]) 11/27/2011  . Female genuine stress incontinence 06/15/2011  . Enterocele 06/15/2011  . Acquired hammer toe 05/24/2011  . Bunion 10/31/2010  . Aortic heart valve narrowing 10/02/2010  . Airway hyperreactivity 10/02/2010  . 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 BP (high blood pressure) 10/02/2010  . Obstructive apnea 10/02/2010  . Episode of syncope 10/02/2010  . 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 Myocardial infarction 10/02/2010  . Type 2 diabetes mellitus ([place]) 10/02/2010  . ASD (atrial septal defect), ostium secundum 06/29/2006  . Diabetes ([place]) 06/29/2006    Past Surgical History:  Procedure Laterality Date  . ABDOMINAL HYSTERECTOMY    . APPENDECTOMY    . bilateral tubal ligation    . BLADDER SURGERY     "repair"  . BREAST BIOPSY Right    negative 3 different biopsies  . BREAST SURGERY    . BUNIONECTOMY Bilateral   . CARDIAC CATHETERIZATION    . COLONOSCOPY N/A 12/06/2014   Procedure: COLONOSCOPY;  Surgeon: Manya Silvas, MD;  Location: [facility] ENDOSCOPY;  Service: Endoscopy;  Laterality: N/A;  . ESOPHAGOGASTRODUODENOSCOPY (EGD) WITH PROPOFOL N/A 02/14/2015   Procedure: ESOPHAGOGASTRODUODENOSCOPY (EGD) WITH PROPOFOL;  Surgeon: Manya Silvas, MD;  Location: [facility] ENDOSCOPY;  Service: Endoscopy;  Laterality: N/A;  . FOOT SURGERY Bilateral   . PELVIC FLOOR REPAIR    . TUBAL LIGATION      Prior to Admission medications   Medication Sig Start Date End Date Taking? Authorizing Provider  albuterol (PROAIR HFA) 108 (90 BASE) MCG/ACT inhaler Inhale 1-2 puffs into the lungs every 4 (four) hours as needed for wheezing or shortness of breath. 08/12/14   Bobetta Lime, MD  albuterol (PROVENTIL) (2.5 MG/3ML) 0.083% nebulizer solution Inhale into the lungs. Reported on 02/03/2015    Historical Provider, MD  allopurinol (ZYLOPRIM) 100 MG tablet Take 200 mg by mouth.     Historical Provider, MD  ammonium lactate (LAC-HYDRIN) 12 % lotion APPLY TO DAMP  SKIN DAILY 09/09/14   Historical Provider, MD  amphetamine-dextroamphetamine (ADDERALL XR) 30 MG 24 hr capsule Take 1 capsule (30 mg total) by mouth daily. 07/05/15   Penni Bombard, MD  amphetamine-dextroamphetamine (ADDERALL XR) 30 MG 24 hr capsule Take 1 capsule (30 mg total) by mouth daily. 11/14/15   Asencion Partridge Dohmeier, MD  amphetamine-dextroamphetamine (ADDERALL) 10 MG tablet Take 1 tablet (10 mg total) by mouth 2 (two) times daily with a meal. 11/14/15   Asencion Partridge Dohmeier, MD  clindamycin (CLINDAGEL) 1 % gel CLINDAMYCIN PHOSPHATE, 1% (External Gel) - Historical Medication  (1 %) Active    Historical Provider, MD  Clobetasol Propionate 0.05 % shampoo  09/10/14   Historical Provider, MD  cyclobenzaprine (FLEXERIL) 10 MG tablet Take 1 tablet (10 mg total) by mouth every 8 (eight) hours as needed for muscle spasms. 06/05/15   Lisa Roca, MD  cyproheptadine (PERIACTIN) 4 MG tablet Take 1 tablet (4 mg total) by mouth 3 (three) times daily as needed for allergies. 01/14/16   Dalyla Chui V Bacon Lettie Czarnecki, PA-C  dexamethasone (DECADRON) 4 MG tablet Take 1 tablet (4 mg total) by mouth 2 (two) times daily. 01/14/16   Kermitt Harjo V Bacon Chantella Creech, PA-C  EPINEPHrine 0.3  mg/0.3 mL IJ SOAJ injection Inject into the muscle once.    Historical Provider, MD  estradiol (ESTRACE) 0.5 MG tablet  06/13/14   Historical Provider, MD  fluticasone (FLONASE) 50 MCG/ACT nasal spray instill 2 sprays into each nostril NIGHTLY 09/08/14   Historical Provider, MD  furosemide (LASIX) 40 MG tablet Take 40 mg by mouth daily. 09/15/14   Historical Provider, MD  gabapentin (NEURONTIN) 300 MG capsule Take 600 mg by mouth at bedtime. 06/21/14   Historical Provider, MD  HYDROcodone-acetaminophen (NORCO/VICODIN) 5-325 MG tablet Take 1 tablet by mouth every 6 (six) hours as needed for moderate pain. 06/05/15   Lisa Roca, MD  ibuprofen (ADVIL,MOTRIN) 600 MG tablet Take 1 tablet (600 mg total) by mouth every 8 (eight) hours as needed. 06/05/15   Lisa Roca, MD   levothyroxine (SYNTHROID, LEVOTHROID) 25 MCG tablet Take by mouth.    Historical Provider, MD  losartan (COZAAR) 50 MG tablet Take by mouth. 03/18/14   Historical Provider, MD  metoprolol succinate (TOPROL-XL) 50 MG 24 hr tablet Take 1 tablet (50 mg total) by mouth daily. 07/23/14   Bobetta Lime, MD  montelukast (SINGULAIR) 10 MG tablet  09/12/14   Historical Provider, MD  pantoprazole (PROTONIX) 40 MG tablet Take 40 mg by mouth daily. 01/07/15   Historical Provider, MD  pramipexole (MIRAPEX) 1 MG tablet  09/12/14   Historical Provider, MD  ranitidine (ZANTAC) 150 MG tablet Take 1 tablet (150 mg total) by mouth 2 (two) times daily. 01/14/16   Dannette Kinkaid V Bacon Trendon Zaring, PA-C  SYMBICORT 160-4.5 MCG/ACT inhaler inhale 2 puffs by mouth twice a day 12/08/14   Bobetta Lime, MD  tretinoin (RETIN-A) 0.05 % cream  01/19/14   Historical Provider, MD    Allergies Bee venom; Desvenlafaxine; Diltiazem hcl; Neomycin; Other; Pneumococcal polysaccharide vaccine; Prednisone; Latex; Ampicillin; Erythromycin; Influenza vaccines; Lamotrigine; Penicillins; Pneumococcal vaccine; Tetracycline; Buspirone; Cortisone; Imipramine; Statins; Tape; and Ziprasidone hcl  Family History  Problem Relation Age of Onset  . Hypertension Mother   . Hypothyroidism Mother   . Heart disease Mother   . Lung cancer Mother   . Alcohol abuse Mother   . Depression Mother   . Hypertension Father   . Heart disease Father   . Alcohol abuse Father   . Hypothyroidism Sister   . Hypertension Sister   . Bipolar disorder Sister   . Heart disease Brother   . Alcohol abuse Brother   . Bipolar disorder Brother   . Hypothyroidism Sister   . Hypertension Sister   . Breast cancer Neg Hx     Social History Social History  Substance Use Topics  . Smoking status: Never Smoker  . Smokeless tobacco: Never Used     Comment: tried smoking in youth  . Alcohol use No     Comment: used when she was 66 years old.     Review of  Systems  Constitutional: Negative for fever. Eyes: Negative for visual changes. ENT: Negative for sore throat. Cardiovascular: Negative for chest pain. Respiratory: Negative for shortness of breath. Gastrointestinal: Negative for abdominal pain, vomiting and diarrhea. Skin: Positive for rash. Neurological: Negative for headaches, focal weakness or numbness. ____________________________________________  PHYSICAL EXAM:  VITAL SIGNS: ED Triage Vitals  Enc Vitals Group     BP 01/14/16 1315 (!) 147/65     Pulse Rate 01/14/16 1315 66     Resp 01/14/16 1315 18     Temp 01/14/16 1315 97.8 F (36.6 C)     Temp Source 01/14/16  1315 Oral     SpO2 01/14/16 1315 96 %     Weight 01/14/16 1316 165 lb (74.8 kg)     Height 01/14/16 1316 5\' 2"  (1.575 m)     Head Circumference --      Peak Flow --      Pain Score 01/14/16 1452 0     Pain Loc --      Pain Edu? --      Excl. in [place]? --    Constitutional: Alert and oriented. Well appearing and in no distress. Head: Normocephalic and atraumatic. Eyes: Conjunctivae are normal. PERRL. Normal extraocular movements. Patient with some local skin irritation to the upper lids bilaterally. Ears: Canals clear. TMs intact bilaterally. Nose: No congestion/rhinorrhea/epistaxis. Mouth/Throat: Mucous membranes are moist. Cardiovascular: Normal rate, regular rhythm. Normal distal pulses. Respiratory: Normal respiratory effort. No wheezes/rales/rhonchi. Musculoskeletal: Nontender with normal range of motion in all extremities.  Skin:  Skin is warm, dry and intact.  Patient is noted to have erythematous, scaly, and inflamed skin to the cheeks bilaterally.. ____________________________________________  PROCEDURES  Famotidine 40 mg PO Periactin 4 mg PO Decadron 10 mg IM ____________________________________________  INITIAL IMPRESSION / ASSESSMENT AND PLAN / ED COURSE  Patient with an allergic reaction to Flagyl cortisone cream placed on the face. She is  treated in the ED with histamine blockade and an initial dose of Decadron IM. She has discontinued the cortisone cream subsequently. She will be discharged with a  prescription for Periactin, Decadron, and ranitidine, for histamine blockade. She will use Aquaphor only to the skin of the face for barrier protection. She will follow with her primary care provider for continued symptoms.   Clinical Course    ____________________________________________  FINAL CLINICAL IMPRESSION(S) / ED DIAGNOSES  Final diagnoses:  Allergic contact dermatitis due to drugs in contact with skin      Melvenia Needles, PA-C 01/14/16 1548    Lisa Roca, MD 01/14/16 1630

## 2016-01-14 NOTE — Discharge Instructions (Signed)
You have sustained an allergic reaction to the steroid cream you used on your face. Take the ranitidine and Periactin for anti-histamine relief. Take the dexamethasone for inflammation relief. Use only Aquaphor on your face to protect from wind, sun, and cold damage. Keep the skin moisturized to prevent cracking and peeling. Follow-up with your provider for continued symptoms.

## 2016-01-17 ENCOUNTER — Ambulatory Visit (INDEPENDENT_AMBULATORY_CARE_PROVIDER_SITE_OTHER): Payer: Medicare HMO | Admitting: Adult Health

## 2016-01-17 ENCOUNTER — Encounter: Payer: Self-pay | Admitting: Adult Health

## 2016-01-17 VITALS — BP 133/57 | HR 60 | Ht 62.0 in | Wt 178.0 lb

## 2016-01-17 DIAGNOSIS — Z9989 Dependence on other enabling machines and devices: Secondary | ICD-10-CM

## 2016-01-17 DIAGNOSIS — G4733 Obstructive sleep apnea (adult) (pediatric): Secondary | ICD-10-CM | POA: Diagnosis not present

## 2016-01-17 DIAGNOSIS — G47419 Narcolepsy without cataplexy: Secondary | ICD-10-CM | POA: Diagnosis not present

## 2016-01-17 MED ORDER — AMPHETAMINE-DEXTROAMPHETAMINE 10 MG PO TABS: 10.0000 mg | ORAL_TABLET | Freq: Two times a day (BID) | ORAL | 0 refills | Status: DC

## 2016-01-17 MED ORDER — AMPHETAMINE-DEXTROAMPHET ER 30 MG PO CP24: 30.0000 mg | ORAL_CAPSULE | Freq: Every day | ORAL | 0 refills | Status: DC

## 2016-01-17 NOTE — Patient Instructions (Signed)
Continue Adderall Prescription sent for new supplies for CPAP- try using CPAP >4 hours every night If your symptoms worsen or you develop new symptoms please let us know.

## 2016-01-17 NOTE — Progress Notes (Signed)
I agree with the assessment and plan as directed by NP . I especially like to encourage higher compliance on CPAP. Stimulant medication was refilled today. The patient is known to me .   Foy Vanduyne, MD

## 2016-01-17 NOTE — Progress Notes (Signed)
PATIENT: Jamie Murillo DOB: 1950/08/15  REASON FOR VISIT: follow up- daytime sleepiness, obstructive sleep apnea on CPAP HISTORY FROM: patient  HISTORY OF PRESENT ILLNESS: Today 01/17/2016: Jamie Murillo is a 66 year old female with a history of daytime sleepiness and obstructive sleep apnea on CPAP. She returns today for an evaluation. Her download indicates that she used her machine 29 out of 30 days for compliance of 97%. She uses her machine greater than 4 hours 15 out of 30 days for compliance of 50%. On average she uses her machine4 hours and 10 minutes. She has a residual AHI of 2.1 on a minimum pressure of 5 cm of water and maximum pressure 15 cm water with EPR of 2. The patient does have a leak in the 95th percentile at 54.4 L/m. She reports that she has not changed her mask, headgear or tubing out in over a year. She states that she does feel it leaking at night. She continues to have daytime sleepiness. Her Epworth sleepiness score is 16 and fatigue severity score is 54. She states that she does not take Adderall consistently. She currently has Adderall XR 30 mg but she states she does not take it daily. She also has Adderall immediate release 10 mg that she takes as needed. She returns today for an evaluation.  HISTORY per Dr. Edwena Felty notes: Jamie Murillo is a 66 y.o. female , seen here as a referral from Dr. Doy Hutching .   HX CD- Jamie Murillo has used CPAP since 2011-  the studies I have available here are from 2016 and show that she is  excessively daytime sleepy while  being sufficiently treated on CPAP. The CPAP interpretation showed a sleep time of 376.5 minutes , a sleep latency of 15 minutes, a sleep efficiency of only 81.1% , REM proportion was 28.3% and the AHI was only 5.4 per hour,  the RDI was 6.4 per hour.  During REM sleep AHI  was increased to 10.1 and in supine position 5.4 AHI.  The patient had borderline bradycardia , with an average heart rate was 68. BPM  however.  There were no periodic limb movements . She was titrated to 9 cm water pressure she also was tried on 10 cm water pressure and the technologist noted that she seemed to do better.  It was recommended to use 10 cm water pressure CPAP as the most optimal pressure for her. Patient then was required to stay the next day on 01-20-14 for an MS LT interestingly she was given 5 nap opportunities sleep onsets were supposedly within less than a minute in each of her periods and she had REM onset supposedly and 4 out of 5 naps. 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 Patient was treated with CPAP prior to the MS LT , no need to re-titrate her.  Patient goes to bed between 10 and 11 PM, adheres to sleep hygiene rules. She sleeeps supine, because of the CPAP . She was never tried on a nasal pillow.  She sleeps in a cool, quiet, and dark room, uses CPAP every night/  Sometimes she will find her self sleep walking, eating, . She will get up about 4.30 to help her husband to get ready, sh is retired.  She struggles all day with hypersomnia. She drinks 2 caffeineated sodas. Ice tea. No ETOH , no tobacco use.  She does not take scheduled naps, and  had insomnia before. The current degree of sleepiness begun 3- years ago. In the last 18 month she gained 35 pounds!   Interval history from 09-02-14- I still have no access to a baseline sleep study for this patient, and I'm given data from as early as 2016 reflecting a CPAP titration only but no baseline study. Given the difficulties we have to control the patient's excessive daytime sleepiness, she still endorses the Epworth Sleepiness Scale at 22 points and the fatigue severity scale at 61 points, it would have been helpful to at least obtain a download. These data are not available from her machine either. She is using currently a full face mask, AIR fit F , 10 by [employer]. Given her  reported history of imipramine allergy she was not able to take Tofranil she stated. So I have no cataplexy controlling medication for this patient. She carries a diagnosis of ADD and the use of stimulants can treat both excessive daytime sleepiness and ADD, but it is not an optimal treatment option for a lady of 66 years of age. Especially with heart disease  Blood pressure, obesity etc.  Interval history from 11-03-14. Jamie Murillo underwent a sleep study a baseline polysomnography on 10-17-14.patient carries a diagnosis of narcolepsy and always a had been re-titrated to a CPAP machine , but I had no access to at baseline diagnostic sleep study.  For this reason the new sleep study was ordered. The patient's sleep latency was 34 minutes REM latency was 62.5 minutes sleep efficiency was 88.4 her AHI was 11.8 which constitutes mild sleep apnea. Nadir of oxygen was 75% but was only 21 minutes total of desaturation time. She did not retain CO2 above 50 torr, the study revealed that obstructive sleep apnea was still present and given the patient's very high degree of sleepiness should be treated. I advised to proceed with an hour told titrate her. It was only yesterday October 25 that we get the green light [facility] and they will provide an auto titrate a setting from 5-15 cm pressure window. She can use any interface to her comfort. She has been using full face masks only in  the past. She would like to continue.   Jamie Murillo doesn't drive  Her second sleep disorder narcolepsy had been diagnosed in MSLT  daytime studies. The medication usually used to treat narcolepsy and is related sleepiness bears however several risks for this patient. She has a history of heart disease and she is in stage III chronic kidney disease. We discussed today the use of a stimulant such as Ritalin or Adderall which will make it easier for her to stay awake in daytime. I am not optimistic that it will reduce her  Epworth sleepiness score below 10 but it may work temporarily and give her a predictable time window to operate machinery or car. She endorsed today the Epworth score at 22 points and the fatigue severity score at 55 points. The geriatric depression score was endorsed at 2 points. My plan is to have Jamie Murillo use the outflow CPAP and see her after 30 days of using the machine. We will then reobtain an Epworth sleepiness score. In the meantime I will order a very low dose of Ritalin for her, to allow her more wakefulness in daytime. Based on her renal disease and heart condition it is not advisable to treat this patient with Xyrem due to the high sodium load.  Interval history from 05/31/2015 Jamie Murillo is  here for a compliance visit on CPAP. She has used the machine 30 out of 30 days but her average sleep time with the machine is only 3 hours and 8 minutes. She assured me that this is all she sleeps. She is using an AutoSet between 5 and 15 cm water with 2 cm EPR. Her residual AHI is only 3.0. The 95th percentile pressure is 12.6 and the needs to be no adjustments made. I was asked the patient to put the CPAP on an hour before her bedtime if he would fall asleep and that time we have gained an hour of documented sleep time -if not-her compliance record would still benefit The also obtained a new sleepiness score at the patient still endorsed 22 points on her Epworth score 56 on fatigue severity and 4 on geriatric depression.  REVIEW OF SYSTEMS: Out of a complete 14 system review of symptoms, the patient complains only of the following symptoms, and all other reviewed systems are negative.  Eye discharge, eye redness, eye itching, light sensitivity, cough, leg swelling, fatigue, etc. unexpected weight change, apnea, frequent waking, daytime sleepiness, snoring, sleep talking, sleep walking, acting out dreams, neck pain, neck stiffness, rash, muscle cramps, urgency, anemia, memory loss, decreased  concentration  ALLERGIES: Allergies  Allergen Reactions  . Bee Venom Anaphylaxis  . Desvenlafaxine Rash, Shortness Of Breath and Anaphylaxis  . Diltiazem Hcl Anaphylaxis  . Neomycin Anaphylaxis  . Other Anaphylaxis    pistacchios  . Pneumococcal Polysaccharide Vaccine Dermatitis, Rash and Swelling  . Prednisone Anaphylaxis  . Tetracycline Anaphylaxis  . Latex Rash  . Ampicillin Hives    Then anaphylaxsis  . Erythromycin Hives    Then anaphylaxsis  . Influenza Vaccines     Patient has a history of anaphylaxis to neomycin and other agents of that class.    . Lamotrigine Hives  . Penicillins Nausea And Vomiting  . Pneumococcal Vaccine Hives  . Septra [Sulfamethoxazole-Trimethoprim]     Leads to anaphylaxsis  . Buspirone Rash    Leads to anaphylaxsis  . Cortisone Rash  . Imipramine Rash  . Statins Rash    Statin induced myopathy  . Tape Rash    BUSPAR. BUSPAR SEPTRA. SEPTRA  . Ziprasidone Hcl Rash    HOME MEDICATIONS: Outpatient Medications Prior to Visit  Medication Sig Dispense Refill  . albuterol (PROAIR HFA) 108 (90 BASE) MCG/ACT inhaler Inhale 1-2 puffs into the lungs every 4 (four) hours as needed for wheezing or shortness of breath. 1 Inhaler 3  . albuterol (PROVENTIL) (2.5 MG/3ML) 0.083% nebulizer solution Inhale into the lungs. Reported on 02/03/2015    . allopurinol (ZYLOPRIM) 100 MG tablet Take 200 mg by mouth.     Marland Kitchen ammonium lactate (LAC-HYDRIN) 12 % lotion APPLY TO DAMP SKIN DAILY  1  . amphetamine-dextroamphetamine (ADDERALL XR) 30 MG 24 hr capsule Take 1 capsule (30 mg total) by mouth daily. 30 capsule 0  . amphetamine-dextroamphetamine (ADDERALL) 10 MG tablet Take 1 tablet (10 mg total) by mouth 2 (two) times daily with a meal. 60 tablet 0  . Clobetasol Propionate 0.05 % shampoo   0  . cyproheptadine (PERIACTIN) 4 MG tablet Take 1 tablet (4 mg total) by mouth 3 (three) times daily as needed for allergies. 30 tablet 0  . estradiol (ESTRACE) 0.5 MG tablet  Take 0.5 mg by mouth daily.   0  . fluticasone (FLONASE) 50 MCG/ACT nasal spray instill 2 sprays into each nostril NIGHTLY  0  . furosemide (LASIX) 40  MG tablet Take 40 mg by mouth daily.  0  . gabapentin (NEURONTIN) 300 MG capsule Take 600 mg by mouth at bedtime.  0  . levothyroxine (SYNTHROID, LEVOTHROID) 25 MCG tablet Take by mouth.    . losartan (COZAAR) 50 MG tablet Take by mouth.    . metoprolol succinate (TOPROL-XL) 50 MG 24 hr tablet Take 1 tablet (50 mg total) by mouth daily. 90 tablet 3  . montelukast (SINGULAIR) 10 MG tablet   0  . pantoprazole (PROTONIX) 40 MG tablet Take 40 mg by mouth daily.  0  . pramipexole (MIRAPEX) 1 MG tablet   0  . ranitidine (ZANTAC) 150 MG tablet Take 1 tablet (150 mg total) by mouth 2 (two) times daily. 20 tablet 0  . SYMBICORT 160-4.5 MCG/ACT inhaler inhale 2 puffs by mouth twice a day 10.2 Inhaler 2  . tretinoin (RETIN-A) 0.05 % cream Apply topically at bedtime.     Marland Kitchen amphetamine-dextroamphetamine (ADDERALL XR) 30 MG 24 hr capsule Take 1 capsule (30 mg total) by mouth daily. 30 capsule 0  . clindamycin (CLINDAGEL) 1 % gel CLINDAMYCIN PHOSPHATE, 1% (External Gel) - Historical Medication  (1 %) Active    . cyclobenzaprine (FLEXERIL) 10 MG tablet Take 1 tablet (10 mg total) by mouth every 8 (eight) hours as needed for muscle spasms. 20 tablet 0  . dexamethasone (DECADRON) 4 MG tablet Take 1 tablet (4 mg total) by mouth 2 (two) times daily. (Patient not taking: Reported on 01/17/2016) 10 tablet 0  . EPINEPHrine 0.3 mg/0.3 mL IJ SOAJ injection Inject into the muscle once.    Marland Kitchen HYDROcodone-acetaminophen (NORCO/VICODIN) 5-325 MG tablet Take 1 tablet by mouth every 6 (six) hours as needed for moderate pain. 10 tablet 0  . ibuprofen (ADVIL,MOTRIN) 600 MG tablet Take 1 tablet (600 mg total) by mouth every 8 (eight) hours as needed. (Patient not taking: Reported on 01/17/2016) 20 tablet 0   No facility-administered medications prior to visit.     PAST MEDICAL  HISTORY: Past Medical History:  Diagnosis Date  . Adult attention deficit disorder   . Anemia   . Bipolar disorder ([place])   . Cancer ([place])    skin  . CHF NYHA class I ([facility])    chronic, diastolic  . Chronic asthma   . Chronic renal impairment, stage 3 (moderate)   . COPD (chronic obstructive pulmonary disease) ([place])   . Coronary artery disease with history of myocardial infarction without history of CABG   . Depression   . Diabetes mellitus without complication ([place])   . Dysrhythmia   . Fibromyalgia   . Fibromyalgia   . GERD without esophagitis   . Gout   . Gout   . Heart murmur   . Hepatitis B   . Hepatitis C   . History of chronic hepatitis    Hepatitis B and C  . Hyperlipidemia   . Hypertension   . Hypothyroidism, adult   . 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 Obstructive sleep apnea, adult    CPAP  . 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 Patent foramen ovale   . Postural orthostatic tachycardia syndrome   . Raynaud's disease   . Raynaud's disease without gangrene   . Renal insufficiency    2008 per pt  . Restless leg syndrome, controlled   . Rosacea   . Secondary hyperparathyroidism, renal ([place])   . Statin intolerance   . Surgical menopause on hormone replacement  therapy   . Syncope and collapse     PAST SURGICAL HISTORY: Past Surgical History:  Procedure Laterality Date  . ABDOMINAL HYSTERECTOMY    . APPENDECTOMY    . bilateral tubal ligation    . BLADDER SURGERY     "repair"  . BREAST BIOPSY Right    negative 3 different biopsies  . BREAST SURGERY    . BUNIONECTOMY Bilateral   . CARDIAC CATHETERIZATION    . COLONOSCOPY N/A 12/06/2014   Procedure: COLONOSCOPY;  Surgeon: Manya Silvas, MD;  Location: [facility] ENDOSCOPY;  Service: Endoscopy;  Laterality: N/A;  . ESOPHAGOGASTRODUODENOSCOPY (EGD) WITH PROPOFOL N/A 02/14/2015   Procedure: ESOPHAGOGASTRODUODENOSCOPY (EGD) WITH PROPOFOL;  Surgeon: Manya Silvas, MD;  Location: [facility] ENDOSCOPY;  Service: Endoscopy;  Laterality: N/A;  . FOOT SURGERY Bilateral   . PELVIC FLOOR REPAIR    . TUBAL LIGATION      FAMILY HISTORY: Family History  Problem Relation Age of Onset  . Hypertension Mother   . Hypothyroidism Mother   . Heart disease Mother   . Lung cancer Mother   . Alcohol abuse Mother   . Depression Mother   . Hypertension Father   . Heart disease Father   . Alcohol abuse Father   . Hypothyroidism Sister   . Hypertension Sister   . Bipolar disorder Sister   . Heart disease Brother   . Alcohol abuse Brother   . Bipolar disorder Brother   . Hypothyroidism Sister   . Hypertension Sister   . Breast cancer Neg Hx     SOCIAL HISTORY: Social History   Social History  . Marital status: Married    Spouse name: Philippa Chester  . Number of children: 3  . Years of education: 22   Occupational History  . unemployed    Social History Main Topics  . Smoking status: Never Smoker  . Smokeless tobacco: Never Used     Comment: tried smoking in youth  . Alcohol use No     Comment: used when she was 67 years old.   . Drug use:     Types: LSD, Marijuana     Comment: 07/21/14 "tried in youth"  . Sexual activity: Yes    Birth control/ protection: None   Other Topics Concern  . Not on file   Social History Narrative   Married, lives at home with husband   Caffeine use- 1 soda, 1 tea daily      PHYSICAL EXAM  Vitals:   01/17/16 0813  BP: (!) 133/57  Pulse: 60  Weight: 178 lb (80.7 kg)  Height: 5\' 2"  (1.575 m)   Body mass index is 32.56 kg/m.  Generalized: Well developed, in no acute distress  Neck: Circumference 15-1/2 inches  Neurological examination  Mentation: Alert oriented to time, place, history taking. Follows all commands speech and language fluent Cranial nerve II-XII: Pupils were equal round reactive to light. Extraocular movements were full, visual field were full on confrontational test. Facial sensation and  strength were normal. Uvula tongue midline. Head turning and shoulder shrug  were normal and symmetric. Mallampati 3+ Motor: The motor testing reveals 5 over 5 strength of all 4 extremities. Good symmetric motor tone is noted throughout.  Sensory: Sensory testing is intact to soft touch on all 4 extremities. No evidence of extinction is noted.  Coordination: Cerebellar testing reveals good finger-nose-finger and heel-to-shin bilaterally.  Gait and station: Gait is normal. Tandem gait is unsteady. Romberg is negative. No drift is seen.  Reflexes: Deep tendon reflexes are symmetric and normal bilaterally.   DIAGNOSTIC DATA (LABS, IMAGING, TESTING) - I reviewed patient records, labs, notes, testing and imaging myself where available.  Lab Results  Component Value Date   WBC 7.5 06/05/2015   HGB 11.0 (L) 06/05/2015   HCT 33.1 (L) 06/05/2015   MCV 94.4 06/05/2015   PLT 242 06/05/2015      Component Value Date/Time   NA 141 06/05/2015 1005   K 3.8 06/05/2015 1005   CL 110 06/05/2015 1005   CO2 25 06/05/2015 1005   GLUCOSE 161 (H) 06/05/2015 1005   BUN 38 (H) 06/05/2015 1005   CREATININE 1.66 (H) 06/05/2015 1005   CALCIUM 9.3 06/05/2015 1005   PROT 6.8 06/05/2015 1005   ALBUMIN 3.8 06/05/2015 1005   AST 19 06/05/2015 1005   ALT 17 06/05/2015 1005   ALKPHOS 85 06/05/2015 1005   BILITOT 0.4 06/05/2015 1005   GFRNONAA 32 (L) 06/05/2015 1005   GFRAA 37 (L) 06/05/2015 1005       ASSESSMENT AND PLAN 66 y.o. year old female  has a past medical history of Adult attention deficit disorder; Anemia; Bipolar disorder ([place]); Cancer [facility]); CHF NYHA class I ([place]); Chronic asthma; Chronic renal impairment, stage 3 (moderate); COPD (chronic obstructive pulmonary disease) ([place]); Coronary artery disease with history of myocardial infarction without history of CABG; Depression; Diabetes mellitus without complication ([place]); Dysrhythmia; Fibromyalgia; Fibromyalgia; GERD without esophagitis; Gout; Gout;  Heart murmur; Hepatitis B; Hepatitis C; History of chronic hepatitis; Hyperlipidemia; Hypertension; Hypothyroidism, adult; Narcolepsy due to underlying condition without cataplexy; Obstructive sleep apnea, adult; Osteoarthritis of spine without myelopathy or radiculopathy; Patent foramen ovale; Postural orthostatic tachycardia syndrome; Raynaud's disease; Raynaud's disease without gangrene; Renal insufficiency; Restless leg syndrome, controlled; Rosacea; Secondary hyperparathyroidism, renal ([place]); Statin intolerance; Surgical menopause on hormone replacement therapy; and Syncope and collapse. here with:  1. Obstructive sleep apnea on CPAP 2. narcolepsy  The patient is encouraged to use her CPAP nightly for greater than 4 hours each night. I will send a prescription to her DME company requesting new supplies. The patient is also encouraged to take Adderall XR 30 mg daily to prevent sleepiness. She can use Adderall immediate release 10 mg twice a day as needed. Patient verbalized understanding. She will follow-up in 6 months or sooner if needed.      Ward Givens, MSN, NP-C 01/17/2016, 8:46 AM [facility] [address], [place], [place] [address] [phone]

## 2016-01-22 ENCOUNTER — Ambulatory Visit
Admission: EM | Admit: 2016-01-22 | Discharge: 2016-01-22 | Disposition: A | Discharge status: Home / Self Care | Payer: Medicare HMO | Attending: Emergency Medicine | Admitting: Emergency Medicine

## 2016-01-22 ENCOUNTER — Emergency Department
Admission: EM | Admit: 2016-01-22 | Discharge: 2016-01-22 | Disposition: A | Discharge status: Home / Self Care | Payer: Medicare HMO | Attending: Emergency Medicine | Admitting: Emergency Medicine

## 2016-01-22 ENCOUNTER — Emergency Department: Payer: Medicare HMO

## 2016-01-22 ENCOUNTER — Encounter: Payer: Self-pay | Admitting: *Deleted

## 2016-01-22 DIAGNOSIS — R270 Ataxia, unspecified: Secondary | ICD-10-CM

## 2016-01-22 DIAGNOSIS — N183 Chronic kidney disease, stage 3 (moderate): Secondary | ICD-10-CM | POA: Insufficient documentation

## 2016-01-22 DIAGNOSIS — F909 Attention-deficit hyperactivity disorder, unspecified type: Secondary | ICD-10-CM | POA: Diagnosis not present

## 2016-01-22 DIAGNOSIS — L0100 Impetigo, unspecified: Secondary | ICD-10-CM | POA: Diagnosis not present

## 2016-01-22 DIAGNOSIS — Z79899 Other long term (current) drug therapy: Secondary | ICD-10-CM | POA: Insufficient documentation

## 2016-01-22 DIAGNOSIS — I2510 Atherosclerotic heart disease of native coronary artery without angina pectoris: Secondary | ICD-10-CM | POA: Insufficient documentation

## 2016-01-22 DIAGNOSIS — E1122 Type 2 diabetes mellitus with diabetic chronic kidney disease: Secondary | ICD-10-CM | POA: Diagnosis not present

## 2016-01-22 DIAGNOSIS — R21 Rash and other nonspecific skin eruption: Secondary | ICD-10-CM | POA: Diagnosis present

## 2016-01-22 DIAGNOSIS — J449 Chronic obstructive pulmonary disease, unspecified: Secondary | ICD-10-CM | POA: Diagnosis not present

## 2016-01-22 DIAGNOSIS — I130 Hypertensive heart and chronic kidney disease with heart failure and stage 1 through stage 4 chronic kidney disease, or unspecified chronic kidney disease: Secondary | ICD-10-CM | POA: Insufficient documentation

## 2016-01-22 DIAGNOSIS — I5032 Chronic diastolic (congestive) heart failure: Secondary | ICD-10-CM | POA: Diagnosis not present

## 2016-01-22 DIAGNOSIS — G4700 Insomnia, unspecified: Secondary | ICD-10-CM | POA: Diagnosis not present

## 2016-01-22 LAB — BASIC METABOLIC PANEL
Anion gap: 8 (ref 5–15)
BUN: 56 mg/dL — AB (ref 6–20)
CHLORIDE: 107 mmol/L (ref 101–111)
CO2: 23 mmol/L (ref 22–32)
CREATININE: 1.75 mg/dL — AB (ref 0.44–1.00)
Calcium: 9.6 mg/dL (ref 8.9–10.3)
GFR calc Af Amer: 34 mL/min — ABNORMAL LOW (ref >60)
GFR calc non Af Amer: 29 mL/min — ABNORMAL LOW (ref >60)
GLUCOSE: 171 mg/dL — AB (ref 65–99)
POTASSIUM: 4.3 mmol/L (ref 3.5–5.1)
SODIUM: 138 mmol/L (ref 135–145)

## 2016-01-22 LAB — URINALYSIS, COMPLETE (UACMP) WITH MICROSCOPIC
Bilirubin Urine: NEGATIVE
Glucose, UA: 50 mg/dL — AB
Hgb urine dipstick: NEGATIVE
KETONES UR: NEGATIVE mg/dL
Leukocytes, UA: NEGATIVE
Nitrite: NEGATIVE
PH: 6 (ref 5.0–8.0)
Protein, ur: NEGATIVE mg/dL
RBC / HPF: NONE SEEN RBC/hpf (ref 0–5)
SPECIFIC GRAVITY, URINE: 1.015 (ref 1.005–1.030)

## 2016-01-22 LAB — CBC
HEMATOCRIT: 37.9 % (ref 35.0–47.0)
Hemoglobin: 12.7 g/dL (ref 12.0–16.0)
MCH: 32.9 pg (ref 26.0–34.0)
MCHC: 33.6 g/dL (ref 32.0–36.0)
MCV: 97.9 fL (ref 80.0–100.0)
PLATELETS: 267 10*3/uL (ref 150–440)
RBC: 3.87 MIL/uL (ref 3.80–5.20)
RDW: 15.5 % — AB (ref 11.5–14.5)
WBC: 10.3 10*3/uL (ref 3.6–11.0)

## 2016-01-22 MED ORDER — TRAZODONE HCL 50 MG PO TABS
50.0000 mg | ORAL_TABLET | Freq: Every day | ORAL | Status: DC
  Administered 2016-01-22: 50 mg via ORAL
  Filled 2016-01-22: qty 1

## 2016-01-22 MED ORDER — TRAZODONE HCL 50 MG PO TABS: 50.0000 mg | ORAL_TABLET | Freq: Every day | ORAL | 0 refills | Status: DC

## 2016-01-22 MED ORDER — CEPHALEXIN 500 MG PO CAPS
500.0000 mg | ORAL_CAPSULE | Freq: Once | ORAL | Status: AC
  Administered 2016-01-22: 500 mg via ORAL
  Filled 2016-01-22: qty 1

## 2016-01-22 MED ORDER — CEPHALEXIN 500 MG PO CAPS: 500.0000 mg | ORAL_CAPSULE | Freq: Two times a day (BID) | ORAL | 0 refills | Status: DC

## 2016-01-22 NOTE — ED Provider Notes (Signed)
HPI  SUBJECTIVE:  Jamie Murillo is a 66 y.o. female who presents with a persistent painful facial rash with crusting has been presents after she applied hydrocortisone cream to her face last week. He has a confirmed allergy to hydrocortisone cream. Patient was seen in the ER 1/6 for this, She was given Decadron and H1 H2 blocker. She was discharged with a prescription for Periactin, Decadron, ranitidine, told to use Aquaphor to this skin of her face. And told to follow-up with her PMD.  However, she reports blurry vision, states that she is unable to read or drive because she cannot focus for the past 4-5 days. States that her vision is better today. She is not sure if this is both eyes are single eye. She reports right-sided facial asymmetry that his been constant over the past 5 days. She reports discoordination and states that she feels "off-balance" in and states that she is listing consistently to the right. She states that she is having difficulty remembering things and difficulty finding her words. On the symptoms are new. She denies decreased vision visual loss. She states that her glasses are recurrent prescription. She denies arm or leg weakness, dysarthria, vertigo. No neck pain. No headache, nausea, vomiting, fevers. She has had no other change in her medicines recently other than the Periactin, Decadron and ranitidine. She states that she finished this yesterday.  She also reports as single episode of chest pain or shortness of breath, nausea, diaphoresis and palpitations 5 days ago. She describes the pain as stabbing, substernal in lasting approximately 10-15 minutes. She denies radiation to her neck, arm, back. No abdominal pain, syncope. No exertional positional component but states that she did not get up and exert herself while having this pain. She has never had chest pain like this before. She has not had any further episodes of chest pain.  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 He has no past medical history of atrial fibrillation, arrhythmia, stroke, known aneurysm. She is not a smoker. Family history significant for father with stroke at age 46.    Past Medical History:  Diagnosis Date  . Adult attention deficit disorder   . Anemia   . Bipolar disorder ([place])   . Cancer ([place])    skin  . CHF NYHA class I (HCC)    chronic, diastolic  . Chronic asthma   . Chronic renal impairment, stage 3 (moderate)   . COPD (chronic obstructive pulmonary disease) ([place])   . Coronary artery disease with history of myocardial infarction without history of CABG   . Depression   . Diabetes mellitus without complication ([place])   . Dysrhythmia   . Fibromyalgia   . Fibromyalgia   . GERD without esophagitis   . Gout   . Gout   . Heart murmur   . Hepatitis B   . Hepatitis C   . History of chronic hepatitis    Hepatitis B and C  . Hyperlipidemia   . Hypertension   . Hypothyroidism, adult   . 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 Obstructive sleep apnea, adult    CPAP  . 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 Patent foramen ovale   . Postural orthostatic tachycardia syndrome   . Raynaud's disease   . Raynaud's disease  without gangrene   . Renal insufficiency    2008 per pt  . Restless leg syndrome, controlled   . Rosacea   . Secondary hyperparathyroidism, renal ([place])   . Statin intolerance   . Surgical menopause on hormone replacement therapy   . Syncope and collapse     Past Surgical History:  Procedure Laterality Date  . ABDOMINAL HYSTERECTOMY    . APPENDECTOMY    . bilateral tubal ligation    . BLADDER SURGERY     "repair"  . BREAST BIOPSY Right    negative 3 different biopsies  .  BREAST SURGERY    . BUNIONECTOMY Bilateral   . CARDIAC CATHETERIZATION    . COLONOSCOPY N/A 12/06/2014   Procedure: COLONOSCOPY;  Surgeon: Manya Silvas, MD;  Location: [facility] ENDOSCOPY;  Service: Endoscopy;  Laterality: N/A;  . ESOPHAGOGASTRODUODENOSCOPY (EGD) WITH PROPOFOL N/A 02/14/2015   Procedure: ESOPHAGOGASTRODUODENOSCOPY (EGD) WITH PROPOFOL;  Surgeon: Manya Silvas, MD;  Location: [facility] ENDOSCOPY;  Service: Endoscopy;  Laterality: N/A;  . FOOT SURGERY Bilateral   . PELVIC FLOOR REPAIR    . TUBAL LIGATION      Family History  Problem Relation Age of Onset  . Hypertension Mother   . Hypothyroidism Mother   . Heart disease Mother   . Lung cancer Mother   . Alcohol abuse Mother   . Depression Mother   . Hypertension Father   . Heart disease Father   . Alcohol abuse Father   . Hypothyroidism Sister   . Hypertension Sister   . Bipolar disorder Sister   . Heart disease Brother   . Alcohol abuse Brother   . Bipolar disorder Brother   . Hypothyroidism Sister   . Hypertension Sister   . Breast cancer Neg Hx     Social History  Substance Use Topics  . Smoking status: Never Smoker  . Smokeless tobacco: Never Used     Comment: tried smoking in youth  . Alcohol use No     Comment: used when she was 66 years old.     No current facility-administered medications for this encounter.   Current Outpatient Prescriptions:  .  albuterol (PROAIR HFA) 108 (90 BASE) MCG/ACT inhaler, Inhale 1-2 puffs into the lungs every 4 (four) hours as needed for wheezing or shortness of breath., Disp: 1 Inhaler, Rfl: 3 .  albuterol (PROVENTIL) (2.5 MG/3ML) 0.083% nebulizer solution, Inhale into the lungs. Reported on 02/03/2015, Disp: , Rfl:  .  allopurinol (ZYLOPRIM) 100 MG tablet, Take 200 mg by mouth. , Disp: , Rfl:  .  ammonium lactate (LAC-HYDRIN) 12 % lotion, APPLY TO DAMP SKIN DAILY, Disp: , Rfl: 1 .  amphetamine-dextroamphetamine (ADDERALL XR) 30 MG 24 hr capsule, Take 1 capsule (30 mg  total) by mouth daily., Disp: 30 capsule, Rfl: 0 .  amphetamine-dextroamphetamine (ADDERALL) 10 MG tablet, Take 1 tablet (10 mg total) by mouth 2 (two) times daily with a meal., Disp: 60 tablet, Rfl: 0 .  Clobetasol Propionate 0.05 % shampoo, , Disp: , Rfl: 0 .  estradiol (ESTRACE) 0.5 MG tablet, Take 0.5 mg by mouth daily. , Disp: , Rfl: 0 .  fluticasone (FLONASE) 50 MCG/ACT nasal spray, instill 2 sprays into each nostril NIGHTLY, Disp: , Rfl: 0 .  furosemide (LASIX) 40 MG tablet, Take 40 mg by mouth daily., Disp: , Rfl: 0 .  gabapentin (NEURONTIN) 300 MG capsule, Take 600 mg by mouth at bedtime., Disp: , Rfl: 0 .  levothyroxine (SYNTHROID, LEVOTHROID) 25 MCG tablet,  Take by mouth., Disp: , Rfl:  .  losartan (COZAAR) 50 MG tablet, Take by mouth., Disp: , Rfl:  .  metoprolol succinate (TOPROL-XL) 50 MG 24 hr tablet, Take 1 tablet (50 mg total) by mouth daily., Disp: 90 tablet, Rfl: 3 .  montelukast (SINGULAIR) 10 MG tablet, , Disp: , Rfl: 0 .  pantoprazole (PROTONIX) 40 MG tablet, Take 40 mg by mouth daily., Disp: , Rfl: 0 .  pramipexole (MIRAPEX) 1 MG tablet, , Disp: , Rfl: 0 .  SYMBICORT 160-4.5 MCG/ACT inhaler, inhale 2 puffs by mouth twice a day, Disp: 10.2 Inhaler, Rfl: 2 .  tretinoin (RETIN-A) 0.05 % cream, Apply topically at bedtime. , Disp: , Rfl:   Allergies  Allergen Reactions  . Bee Venom Anaphylaxis  . Desvenlafaxine Rash, Shortness Of Breath and Anaphylaxis  . Diltiazem Hcl Anaphylaxis  . Neomycin Anaphylaxis  . Other Anaphylaxis    pistacchios  . Pneumococcal Polysaccharide Vaccine Dermatitis, Rash and Swelling  . Prednisone Anaphylaxis  . Tetracycline Anaphylaxis  . Latex Rash  . Ampicillin Hives    Then anaphylaxsis  . Erythromycin Hives    Then anaphylaxsis  . Influenza Vaccines     Patient has a history of anaphylaxis to neomycin and other agents of that class.    . Lamotrigine Hives  . Penicillins Nausea And Vomiting  . Pneumococcal Vaccine Hives  . Septra  [Sulfamethoxazole-Trimethoprim]     Leads to anaphylaxsis  . Buspirone Rash    Leads to anaphylaxsis  . Cortisone Rash  . Imipramine Rash  . Statins Rash    Statin induced myopathy  . Tape Rash    BUSPAR. BUSPAR SEPTRA. SEPTRA  . Ziprasidone Hcl Rash     ROS  As noted in HPI.   Physical Exam  BP (!) 152/95 (BP Location: Left Arm)   Pulse (!) 56   Temp 97.4 F (36.3 C) (Oral)   Resp 18   Ht 5\' 3"  (1.6 m)   Wt 175 lb (79.4 kg)   SpO2 100%   BMI 31.00 kg/m   Constitutional: Well developed, well nourished, no acute distress Eyes: PERRL, EOMI, conjunctiva normal bilaterally HENT: Normocephalic, atraumatic,mucus membranes moist Respiratory: Clear to auscultation bilaterally, no rales, no wheezing, no rhonchi Cardiovascular: Normal rate and rhythm, no murmurs, no gallops, no rubs. No carotid bruit. RP, PT pulses 2+ and equal bilaterally  GI: nondistended,  skin: No rash, skin intact Musculoskeletal: No edema, no tenderness, no deformities Neurologic: Alert & oriented x 3, Face seems asymmetric, pulling to the right, but there is no definite droop. Cranial nerves II through XII otherwise intact, no pronator drift upper or lower extremities, grip strength equal bilaterally. Patient has no obvious arm or leg weakness, she has difficulty with finger-nose bilaterally, but heel shin within normal limits. Tandem gait is unsteady, she consistently falls to the right. Romberg negative. sensation grossly intact Psychiatric: Speech and behavior appropriate   ED Course   Medications - No data to display  No orders of the defined types were placed in this encounter.  No results found for this or any previous visit (from the past 24 hour(s)). No results found.  ED Clinical Impression  Ataxia   ED Assessment/Plan  concern for stroke, carotid artery occlusion,. Carotid artery dissection in the differential given history of coronary artery dissection. Her physical exam is  remarkable for the ataxia and discoordination, but she does not appear to be actively having a stroke at this point time. She is not having  any chest pain. The symptoms have been present for 5 days, so she is out of the window for TPA. This may also be simply side effects from the medicines, most specifically the Periactin, however, discoordination and visual changes is not listed as a side effect.  Transferring to the [facility] ED for a comprehensive evaluation and workup. Offered to send patient via EMS, but I also think that she is stable to go by private vehicle. Patient has opted to go by private vehicle. Notified ED.  Discussed MDM, and rationale for transfer with the patient. She agrees to go immediately to the ED.  No orders of the defined types were placed in this encounter.   *This clinic note was created using Dragon dictation software. Therefore, there may be occasional mistakes despite careful proofreading.  ?   Melynda Ripple, MD 01/22/16 1243

## 2016-01-22 NOTE — ED Triage Notes (Addendum)
Pt with "allergic reaction" starting last week and seen at ED and given Decadron. Also with "infected pimples" to face. Facial asymmetry pt reports is new yesterday. Left sided mouth droop. Grips strong and equal bilaterally. No palmar drift. Denies numbness or tingling.  Pt would like a steroid and antibiotics. Denies pain.

## 2016-01-22 NOTE — ED Provider Notes (Signed)
[facility] Emergency Department Provider Note  Time seen: 4:12 PM  I have reviewed the triage vital signs and the nursing notes.   HISTORY  Chief Complaint Gait Problem    HPI Jamie Murillo is a 66 y.o. female with multiple medical issues who presents the emergency department for a facial rash, referred from urgent care for ataxia. According to the patient and her husband for the past one week she has had a rash to her face, she tried hydrocortisone and thought she could be having a reaction to the hydrocortisone so she went to the urgent care. While at the urgent care the patient also mentioned that she has been very off balance and falling to her right side for the past for 5 days. Denies any history of stroke, denies any focal weakness or numbness. Husband states the patient has only been sleeping a couple hours a night for the past one week. Patient states she does not know why she is not sleeping at night. States she is under a lot of stress recently. Denies any chest pain, abdominal pain, nausea, vomiting, diarrhea or fever.  Past Medical History:  Diagnosis Date  . Adult attention deficit disorder   . Anemia   . Bipolar disorder ([place])   . Cancer ([place])    skin  . CHF NYHA class I (HCC)    chronic, diastolic  . Chronic asthma   . Chronic renal impairment, stage 3 (moderate)   . COPD (chronic obstructive pulmonary disease) ([place])   . Coronary artery disease with history of myocardial infarction without history of CABG   . Depression   . Diabetes mellitus without complication ([place])   . Dysrhythmia   . Fibromyalgia   . Fibromyalgia   . GERD without esophagitis   . Gout   . Gout   . Heart murmur   . Hepatitis B   . Hepatitis C   . History of chronic hepatitis    Hepatitis B and C  . Hyperlipidemia   . Hypertension   . Hypothyroidism, adult   . 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 Obstructive sleep apnea, adult    CPAP   . 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 Patent foramen ovale   . Postural orthostatic tachycardia syndrome   . Raynaud's disease   . Raynaud's disease without gangrene   . Renal insufficiency    2008 per pt  . Restless leg syndrome, controlled   . Rosacea   . Secondary hyperparathyroidism, renal ([place])   . Statin intolerance   . Surgical menopause on hormone replacement therapy   . Syncope and collapse     Patient Active Problem List   Diagnosis Date Noted  . Excessive sleepiness while driving [id number]  . 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 OSA (obstructive sleep apnea) 11/03/2014  . Attention deficit hyperactivity disorder 11/03/2014  . Major depressive disorder 10/12/2014  . Peripheral arterial occlusive disease ([place]) 10/12/2014  . Hypersomnia with sleep apnea 07/21/2014  . 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 Restless legs syndrome (RLS) 07/21/2014  . OSA on CPAP 07/21/2014  . Chronic diastolic heart failure ([place]) 06/16/2014  . Arteriosclerosis of coronary artery 06/16/2014  . Gastro-esophageal reflux disease without esophagitis 06/16/2014  . H/O hepatic disease 06/16/2014  . Arthralgia of multiple joints 06/16/2014  . 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 Angiopathy, peripheral ([place]) 06/16/2014  . FO (foramen ovale) 06/16/2014  . Restless leg 06/16/2014  . Paroxysmal  digital cyanosis 06/16/2014  . Hyperparathyroidism due to renal insufficiency ([place]) 06/16/2014  . Drug intolerance 06/16/2014  . 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 DDD (degenerative disc disease), lumbar 06/03/2014  . Chronic kidney disease (CKD), stage III (moderate) 04/12/2014  . Degenerative arthritis of spine 04/12/2014  . Acne erythematosa 04/12/2014  . Other specified health status 04/12/2014  . Adaptive colitis 07/17/2013  . ADD (attention deficit disorder) 10/07/2012  . 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 Fibrositis 05/07/2012   . Pelvic muscle wasting 05/07/2012  . Hemorrhoid 01/18/2012  . Absolute anemia 11/27/2011  . Gout 11/27/2011  . HLD (hyperlipidemia) 11/27/2011  . Adult hypothyroidism 11/27/2011  . Chronic obstructive pulmonary disease ([place]) 11/27/2011  . Female genuine stress incontinence 06/15/2011  . Enterocele 06/15/2011  . Acquired hammer toe 05/24/2011  . Bunion 10/31/2010  . Aortic heart valve narrowing 10/02/2010  . Airway hyperreactivity 10/02/2010  . 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 BP (high blood pressure) 10/02/2010  . Obstructive apnea 10/02/2010  . Episode of syncope 10/02/2010  . 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 Myocardial infarction 10/02/2010  . Type 2 diabetes mellitus ([place]) 10/02/2010  . ASD (atrial septal defect), ostium secundum 06/29/2006  . Diabetes ([place]) 06/29/2006    Past Surgical History:  Procedure Laterality Date  . ABDOMINAL HYSTERECTOMY    . APPENDECTOMY    . bilateral tubal ligation    . BLADDER SURGERY     "repair"  . BREAST BIOPSY Right    negative 3 different biopsies  . BREAST SURGERY    . BUNIONECTOMY Bilateral   . CARDIAC CATHETERIZATION    . COLONOSCOPY N/A 12/06/2014   Procedure: COLONOSCOPY;  Surgeon: Manya Silvas, MD;  Location: [facility] ENDOSCOPY;  Service: Endoscopy;  Laterality: N/A;  . ESOPHAGOGASTRODUODENOSCOPY (EGD) WITH PROPOFOL N/A 02/14/2015   Procedure: ESOPHAGOGASTRODUODENOSCOPY (EGD) WITH PROPOFOL;  Surgeon: Manya Silvas, MD;  Location: [facility] ENDOSCOPY;  Service: Endoscopy;  Laterality: N/A;  . FOOT SURGERY Bilateral   . PELVIC FLOOR REPAIR    . TUBAL LIGATION      Prior to Admission medications   Medication Sig Start Date End Date Taking? Authorizing Provider  albuterol (PROAIR HFA) 108 (90 BASE) MCG/ACT inhaler Inhale 1-2 puffs into the lungs every 4 (four) hours as needed for wheezing or shortness of breath. 08/12/14   Bobetta Lime, MD  albuterol (PROVENTIL) (2.5 MG/3ML) 0.083% nebulizer solution Inhale  into the lungs. Reported on 02/03/2015    Historical Provider, MD  allopurinol (ZYLOPRIM) 100 MG tablet Take 200 mg by mouth.     Historical Provider, MD  ammonium lactate (LAC-HYDRIN) 12 % lotion APPLY TO DAMP SKIN DAILY 09/09/14   Historical Provider, MD  amphetamine-dextroamphetamine (ADDERALL XR) 30 MG 24 hr capsule Take 1 capsule (30 mg total) by mouth daily. 01/17/16   Ward Givens, NP  amphetamine-dextroamphetamine (ADDERALL) 10 MG tablet Take 1 tablet (10 mg total) by mouth 2 (two) times daily with a meal. 01/17/16   Ward Givens, NP  Clobetasol Propionate 0.05 % shampoo  09/10/14   Historical Provider, MD  estradiol (ESTRACE) 0.5 MG tablet Take 0.5 mg by mouth daily.  06/13/14   Historical Provider, MD  fluticasone Asencion Islam) 50 MCG/ACT nasal spray instill 2 sprays into each nostril NIGHTLY 09/08/14   Historical Provider, MD  furosemide (LASIX) 40 MG tablet Take 40 mg by mouth daily. 09/15/14   Historical Provider, MD  gabapentin (NEURONTIN) 300 MG capsule Take 600 mg by mouth at bedtime. 06/21/14   Historical Provider,  MD  levothyroxine (SYNTHROID, LEVOTHROID) 25 MCG tablet Take by mouth.    Historical Provider, MD  losartan (COZAAR) 50 MG tablet Take by mouth. 03/18/14   Historical Provider, MD  metoprolol succinate (TOPROL-XL) 50 MG 24 hr tablet Take 1 tablet (50 mg total) by mouth daily. 07/23/14   Bobetta Lime, MD  montelukast (SINGULAIR) 10 MG tablet  09/12/14   Historical Provider, MD  pantoprazole (PROTONIX) 40 MG tablet Take 40 mg by mouth daily. 01/07/15   Historical Provider, MD  pramipexole (MIRAPEX) 1 MG tablet  09/12/14   Historical Provider, MD  SYMBICORT 160-4.5 MCG/ACT inhaler inhale 2 puffs by mouth twice a day 12/08/14   Bobetta Lime, MD  tretinoin (RETIN-A) 0.05 % cream Apply topically at bedtime.  01/19/14   Historical Provider, MD    Allergies  Allergen Reactions  . Bee Venom Anaphylaxis  . Desvenlafaxine Rash, Shortness Of Breath and Anaphylaxis  . Diltiazem Hcl Anaphylaxis   . Neomycin Anaphylaxis  . Other Anaphylaxis    pistacchios  . Pneumococcal Polysaccharide Vaccine Dermatitis, Rash and Swelling  . Prednisone Anaphylaxis  . Tetracycline Anaphylaxis  . Latex Rash  . Ampicillin Hives    Then anaphylaxsis  . Erythromycin Hives    Then anaphylaxsis  . Influenza Vaccines     Patient has a history of anaphylaxis to neomycin and other agents of that class.    . Lamotrigine Hives  . Penicillins Nausea And Vomiting  . Pneumococcal Vaccine Hives  . Septra [Sulfamethoxazole-Trimethoprim]     Leads to anaphylaxsis  . Buspirone Rash    Leads to anaphylaxsis  . Cortisone Rash  . Imipramine Rash  . Statins Rash    Statin induced myopathy  . Tape Rash    BUSPAR. BUSPAR SEPTRA. SEPTRA  . Ziprasidone Hcl Rash    Family History  Problem Relation Age of Onset  . Hypertension Mother   . Hypothyroidism Mother   . Heart disease Mother   . Lung cancer Mother   . Alcohol abuse Mother   . Depression Mother   . Hypertension Father   . Heart disease Father   . Alcohol abuse Father   . Hypothyroidism Sister   . Hypertension Sister   . Bipolar disorder Sister   . Heart disease Brother   . Alcohol abuse Brother   . Bipolar disorder Brother   . Hypothyroidism Sister   . Hypertension Sister   . Breast cancer Neg Hx     Social History Social History  Substance Use Topics  . Smoking status: Never Smoker  . Smokeless tobacco: Never Used     Comment: tried smoking in youth  . Alcohol use No     Comment: used when she was 66 years old.     Review of Systems Constitutional: Negative for fever. Cardiovascular: Negative for chest pain. Respiratory: Negative for shortness of breath. Gastrointestinal: Negative for abdominal pain, vomiting and diarrhea. Genitourinary: Negative for dysuria Skin: Positive rash to face. Neurological: Negative for headache. Denies any focal weakness or numbness but does state a feeling of being off balance. 10-point ROS  otherwise negative.  ____________________________________________   PHYSICAL EXAM:  VITAL SIGNS: ED Triage Vitals  Enc Vitals Group     BP 01/22/16 1412 139/81     Pulse Rate 01/22/16 1412 64     Resp 01/22/16 1412 16     Temp 01/22/16 1412 97.5 F (36.4 C)     Temp Source 01/22/16 1412 Oral     SpO2 01/22/16 1412  98 %     Weight 01/22/16 1412 175 lb (79.4 kg)     Height 01/22/16 1412 5\' 3"  (1.6 m)     Head Circumference --      Peak Flow --      Pain Score 01/22/16 1413 3     Pain Loc --      Pain Edu? --      Excl. in [place]? --     Constitutional: Alert and oriented. Well appearing and in no distress. Eyes: Normal exam ENT   Head: Normocephalic and atraumatic.Patient has mild erythema to both cheeks with mild scabbing.   Mouth/Throat: Mucous membranes are moist. Cardiovascular: Normal rate, regular rhythm. No murmur Respiratory: Normal respiratory effort without tachypnea nor retractions. Breath sounds are clear Gastrointestinal: Soft and nontender. No distention. Musculoskeletal: Nontender with normal range of motion in all extremities. Neurologic:  Normal speech and language. No gross focal neurologic deficits  Skin:  Skin is warm, dry and intact. Slight rash to face as noted above. Psychiatric: Mood and affect are normal.   ____________________________________________    EKG  EKG reviewed and interpreted by myself shows sinus bradycardia at 54 bpm. Narrow QRS, left axis deviation, normal intervals but no concerning ST changes.  ____________________________________________    RADIOLOGY  CT negative MRI negative ____________________________________________   INITIAL IMPRESSION / ASSESSMENT AND PLAN / ED COURSE  Pertinent labs & imaging results that were available during my care of the patient were reviewed by me and considered in my medical decision making (see chart for details).  Patient presents to the urgent care initially for a rash to her  face. The rash appears to be most consistent with possible impetigo. Patient was sent from urgent care to the ER due to her complaints of ataxia. Patient does have very slight ataxia on examination with ambulation. The rest of her neurological exam including strength sensation and cranial nerves are all normal. CT scan of the head is negative, labs are largely at her baseline. Given the patient's reported ataxia and slight ataxia on examination we'll proceed with an MRI to rule out CVA. However I think it is more likely this is due to decreased sleep over the past one week and the patient has been using antihistamines over the past one week. I discussed with the patient prescribing her a medication to help her sleep and discontinuation of antihistamines.  Patient MRI is negative. Highly suspect bacterial infection of the face we will cover with Keflex, which the patient insists she has taken in the past without any adverse reaction. Patient's labs are largely within normal limits. Discussed the patient discontinuing her antihistamines. We will also prescribe trazodone at night and the patient states she has not slept more than a couple hours each night for the past week.  ____________________________________________   FINAL CLINICAL IMPRESSION(S) / ED DIAGNOSES  Ataxia Impetigo    Harvest Dark, MD 01/22/16 [phone]

## 2016-01-22 NOTE — ED Notes (Signed)
Patient transported to MRI 

## 2016-01-22 NOTE — Discharge Instructions (Signed)
Please take your medications as prescribed. Please follow-up with her primary care doctor in 2-3 days for recheck/reevaluation. Return to the emergency department for any worsening symptoms, or any symptom personally concerning to yourself.

## 2016-01-22 NOTE — ED Triage Notes (Addendum)
PT arrived to ED from urgent care who sent pt over after pt began reporting ataxia over the past week along with changes in vision, sweating, chest pain on Wednesday for 15 minutes and nausea. Pt has also been having difficulty sleeping at this time. Pt denies diarrhea, vomiting and fever. Pt deneis SOB at this time. No cough. Pt also verbalized having a 3/10 headache at this time. No hx of stroke.

## 2016-01-22 NOTE — ED Notes (Signed)
Pt back in room s/p MRI. Gave warm blanket.

## 2016-01-31 ENCOUNTER — Telehealth: Payer: Self-pay | Admitting: *Deleted

## 2016-01-31 NOTE — Telephone Encounter (Signed)
Received from [employer] that adderall is on formulary just requires a PA.  Pt received 30 day supply 01/19/16.

## 2016-02-02 NOTE — Telephone Encounter (Signed)
Completed/ faxed notes to covermymeds.

## 2016-02-06 NOTE — Telephone Encounter (Signed)
Approval received for adderall xr 30mg , ID# [id number]  Effective 01/07/16 thru 01/07/17 [employer]  [id number].

## 2016-03-07 ENCOUNTER — Ambulatory Visit
Admission: EM | Admit: 2016-03-07 | Discharge: 2016-03-07 | Disposition: A | Discharge status: Home / Self Care | Payer: Medicare HMO | Attending: Family Medicine | Admitting: Family Medicine

## 2016-03-07 DIAGNOSIS — I5032 Chronic diastolic (congestive) heart failure: Secondary | ICD-10-CM | POA: Insufficient documentation

## 2016-03-07 DIAGNOSIS — I130 Hypertensive heart and chronic kidney disease with heart failure and stage 1 through stage 4 chronic kidney disease, or unspecified chronic kidney disease: Secondary | ICD-10-CM | POA: Diagnosis not present

## 2016-03-07 DIAGNOSIS — E1122 Type 2 diabetes mellitus with diabetic chronic kidney disease: Secondary | ICD-10-CM | POA: Insufficient documentation

## 2016-03-07 DIAGNOSIS — F319 Bipolar disorder, unspecified: Secondary | ICD-10-CM | POA: Insufficient documentation

## 2016-03-07 DIAGNOSIS — N183 Chronic kidney disease, stage 3 (moderate): Secondary | ICD-10-CM | POA: Insufficient documentation

## 2016-03-07 DIAGNOSIS — L719 Rosacea, unspecified: Secondary | ICD-10-CM | POA: Insufficient documentation

## 2016-03-07 DIAGNOSIS — M797 Fibromyalgia: Secondary | ICD-10-CM | POA: Insufficient documentation

## 2016-03-07 DIAGNOSIS — N2581 Secondary hyperparathyroidism of renal origin: Secondary | ICD-10-CM | POA: Insufficient documentation

## 2016-03-07 DIAGNOSIS — J449 Chronic obstructive pulmonary disease, unspecified: Secondary | ICD-10-CM | POA: Insufficient documentation

## 2016-03-07 DIAGNOSIS — I2510 Atherosclerotic heart disease of native coronary artery without angina pectoris: Secondary | ICD-10-CM | POA: Diagnosis not present

## 2016-03-07 DIAGNOSIS — K589 Irritable bowel syndrome without diarrhea: Secondary | ICD-10-CM | POA: Insufficient documentation

## 2016-03-07 DIAGNOSIS — D649 Anemia, unspecified: Secondary | ICD-10-CM | POA: Diagnosis not present

## 2016-03-07 DIAGNOSIS — G2581 Restless legs syndrome: Secondary | ICD-10-CM | POA: Insufficient documentation

## 2016-03-07 DIAGNOSIS — M109 Gout, unspecified: Secondary | ICD-10-CM | POA: Insufficient documentation

## 2016-03-07 DIAGNOSIS — I252 Old myocardial infarction: Secondary | ICD-10-CM | POA: Insufficient documentation

## 2016-03-07 DIAGNOSIS — G4733 Obstructive sleep apnea (adult) (pediatric): Secondary | ICD-10-CM | POA: Insufficient documentation

## 2016-03-07 DIAGNOSIS — K529 Noninfective gastroenteritis and colitis, unspecified: Secondary | ICD-10-CM

## 2016-03-07 DIAGNOSIS — F988 Other specified behavioral and emotional disorders with onset usually occurring in childhood and adolescence: Secondary | ICD-10-CM | POA: Insufficient documentation

## 2016-03-07 DIAGNOSIS — K219 Gastro-esophageal reflux disease without esophagitis: Secondary | ICD-10-CM | POA: Diagnosis not present

## 2016-03-07 DIAGNOSIS — E785 Hyperlipidemia, unspecified: Secondary | ICD-10-CM | POA: Diagnosis not present

## 2016-03-07 DIAGNOSIS — R197 Diarrhea, unspecified: Secondary | ICD-10-CM | POA: Diagnosis present

## 2016-03-07 DIAGNOSIS — Z951 Presence of aortocoronary bypass graft: Secondary | ICD-10-CM | POA: Insufficient documentation

## 2016-03-07 DIAGNOSIS — R001 Bradycardia, unspecified: Secondary | ICD-10-CM | POA: Diagnosis not present

## 2016-03-07 DIAGNOSIS — M5136 Other intervertebral disc degeneration, lumbar region: Secondary | ICD-10-CM | POA: Insufficient documentation

## 2016-03-07 DIAGNOSIS — E039 Hypothyroidism, unspecified: Secondary | ICD-10-CM | POA: Insufficient documentation

## 2016-03-07 DIAGNOSIS — B1910 Unspecified viral hepatitis B without hepatic coma: Secondary | ICD-10-CM | POA: Diagnosis not present

## 2016-03-07 DIAGNOSIS — I7300 Raynaud's syndrome without gangrene: Secondary | ICD-10-CM | POA: Diagnosis not present

## 2016-03-07 DIAGNOSIS — M62838 Other muscle spasm: Secondary | ICD-10-CM | POA: Diagnosis present

## 2016-03-07 DIAGNOSIS — N393 Stress incontinence (female) (male): Secondary | ICD-10-CM | POA: Insufficient documentation

## 2016-03-07 DIAGNOSIS — B1920 Unspecified viral hepatitis C without hepatic coma: Secondary | ICD-10-CM | POA: Diagnosis not present

## 2016-03-07 DIAGNOSIS — E876 Hypokalemia: Secondary | ICD-10-CM | POA: Diagnosis not present

## 2016-03-07 LAB — COMPREHENSIVE METABOLIC PANEL
ALBUMIN: 3.8 g/dL (ref 3.5–5.0)
ALK PHOS: 79 U/L (ref 38–126)
ALT: 17 U/L (ref 14–54)
AST: 23 U/L (ref 15–41)
Anion gap: 10 (ref 5–15)
BILIRUBIN TOTAL: 0.3 mg/dL (ref 0.3–1.2)
BUN: 43 mg/dL — AB (ref 6–20)
CO2: 25 mmol/L (ref 22–32)
CREATININE: 1.9 mg/dL — AB (ref 0.44–1.00)
Calcium: 9.1 mg/dL (ref 8.9–10.3)
Chloride: 103 mmol/L (ref 101–111)
GFR calc Af Amer: 31 mL/min — ABNORMAL LOW (ref >60)
GFR, EST NON AFRICAN AMERICAN: 27 mL/min — AB (ref >60)
GLUCOSE: 111 mg/dL — AB (ref 65–99)
POTASSIUM: 3.4 mmol/L — AB (ref 3.5–5.1)
Sodium: 138 mmol/L (ref 135–145)
TOTAL PROTEIN: 6.7 g/dL (ref 6.5–8.1)

## 2016-03-07 LAB — URINALYSIS, COMPLETE (UACMP) WITH MICROSCOPIC
Bacteria, UA: NONE SEEN
Bilirubin Urine: NEGATIVE
GLUCOSE, UA: NEGATIVE mg/dL
HGB URINE DIPSTICK: NEGATIVE
Ketones, ur: NEGATIVE mg/dL
Leukocytes, UA: NEGATIVE
Nitrite: NEGATIVE
PROTEIN: NEGATIVE mg/dL
RBC / HPF: NONE SEEN RBC/hpf (ref 0–5)
SPECIFIC GRAVITY, URINE: 1.01 (ref 1.005–1.030)
WBC, UA: NONE SEEN WBC/hpf (ref 0–5)
pH: 5.5 (ref 5.0–8.0)

## 2016-03-07 LAB — CBC WITH DIFFERENTIAL/PLATELET
BASOS ABS: 0.1 10*3/uL (ref 0–0.1)
BASOS PCT: 1 %
Eosinophils Absolute: 0.2 10*3/uL (ref 0–0.7)
Eosinophils Relative: 4 %
HEMATOCRIT: 34.3 % — AB (ref 35.0–47.0)
HEMOGLOBIN: 11.4 g/dL — AB (ref 12.0–16.0)
LYMPHS PCT: 21 %
Lymphs Abs: 1.1 10*3/uL (ref 1.0–3.6)
MCH: 31.3 pg (ref 26.0–34.0)
MCHC: 33.3 g/dL (ref 32.0–36.0)
MCV: 94 fL (ref 80.0–100.0)
MONO ABS: 0.5 10*3/uL (ref 0.2–0.9)
Monocytes Relative: 9 %
NEUTROS ABS: 3.4 10*3/uL (ref 1.4–6.5)
NEUTROS PCT: 65 %
Platelets: 264 10*3/uL (ref 150–440)
RBC: 3.65 MIL/uL — AB (ref 3.80–5.20)
RDW: 15.1 % — ABNORMAL HIGH (ref 11.5–14.5)
WBC: 5.2 10*3/uL (ref 3.6–11.0)

## 2016-03-07 LAB — MAGNESIUM: MAGNESIUM: 2.2 mg/dL (ref 1.7–2.4)

## 2016-03-07 MED ORDER — ONDANSETRON 8 MG PO TBDP
8.0000 mg | ORAL_TABLET | Freq: Once | ORAL | Status: AC
  Administered 2016-03-07: 8 mg via ORAL

## 2016-03-07 MED ORDER — POTASSIUM CHLORIDE CRYS ER 20 MEQ PO TBCR
20.0000 meq | EXTENDED_RELEASE_TABLET | Freq: Once | ORAL | Status: AC
  Administered 2016-03-07: 20 meq via ORAL

## 2016-03-07 MED ORDER — ONDANSETRON 8 MG PO TBDP: 8.0000 mg | ORAL_TABLET | Freq: Three times a day (TID) | ORAL | 0 refills | Status: DC | PRN

## 2016-03-07 NOTE — ED Triage Notes (Signed)
Pt c/o migraine, diarrhea since Monday. She also mentions she is incontinent with urine, And having some abdominal cramping.

## 2016-03-07 NOTE — ED Provider Notes (Signed)
[facility]    CSN: [id number] Arrival date & time: 03/07/16  1018     History   Chief Complaint Chief Complaint  Patient presents with  . Spasms    Abdomen  . Diarrhea    HPI Jamie Murillo is a 66 y.o. female.   66 yo female with a c/o diarrhea 2 days ago associated with nausea and abdominal cramping. Denies any fevers,chills, vomiting. However states last night also had muscle cramps in both lower and upper extremities.    The history is provided by the patient.  Diarrhea    Past Medical History:  Diagnosis Date  . Adult attention deficit disorder   . Anemia   . Bipolar disorder ([place])   . Cancer ([place])    skin  . CHF NYHA class I (HCC)    chronic, diastolic  . Chronic asthma   . Chronic renal impairment, stage 3 (moderate)   . COPD (chronic obstructive pulmonary disease) ([place])   . Coronary artery disease with history of myocardial infarction without history of CABG   . Depression   . Diabetes mellitus without complication ([place])   . Dysrhythmia   . Fibromyalgia   . Fibromyalgia   . GERD without esophagitis   . Gout   . Gout   . Heart murmur   . Hepatitis B   . Hepatitis C   . History of chronic hepatitis    Hepatitis B and C  . Hyperlipidemia   . Hypertension   . Hypothyroidism, adult   . 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 Obstructive sleep apnea, adult    CPAP  . 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 Patent foramen ovale   . Postural orthostatic tachycardia syndrome   . Raynaud's disease   . Raynaud's disease without gangrene   . Renal insufficiency    2008 per pt  . Restless leg syndrome, controlled   . Rosacea   . Secondary hyperparathyroidism, renal ([place])   . Statin intolerance   . Surgical menopause on hormone replacement therapy   . Syncope and collapse     Patient Active Problem List   Diagnosis Date Noted  . Excessive sleepiness while driving [id number]  . 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 OSA (obstructive sleep apnea) 11/03/2014  . Attention deficit hyperactivity disorder 11/03/2014  . Major depressive disorder 10/12/2014  . Peripheral arterial occlusive disease ([place]) 10/12/2014  . Hypersomnia with sleep apnea 07/21/2014  . 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 Restless legs syndrome (RLS) 07/21/2014  . OSA on CPAP 07/21/2014  . Chronic diastolic heart failure ([place]) 06/16/2014  . Arteriosclerosis of coronary artery 06/16/2014  . Gastro-esophageal reflux disease without esophagitis 06/16/2014  . H/O hepatic disease 06/16/2014  . Arthralgia of multiple joints 06/16/2014  . 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 Angiopathy, peripheral ([place]) 06/16/2014  . FO (foramen ovale) 06/16/2014  . Restless leg 06/16/2014  . Paroxysmal digital cyanosis 06/16/2014  . Hyperparathyroidism due to renal insufficiency ([place]) 06/16/2014  . Drug intolerance 06/16/2014  . 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 DDD (degenerative disc disease), lumbar 06/03/2014  . Chronic kidney disease (CKD), stage III (moderate) 04/12/2014  . Degenerative arthritis of spine 04/12/2014  . Acne erythematosa 04/12/2014  . Other specified health status 04/12/2014  . Adaptive colitis 07/17/2013  . ADD (attention deficit disorder) 10/07/2012  . 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 Fibrositis 05/07/2012  . Pelvic muscle wasting 05/07/2012  . Hemorrhoid 01/18/2012  .  Absolute anemia 11/27/2011  . Gout 11/27/2011  . HLD (hyperlipidemia) 11/27/2011  . Adult hypothyroidism 11/27/2011  . Chronic obstructive pulmonary disease ([place]) 11/27/2011  . Female genuine stress incontinence 06/15/2011  . Enterocele 06/15/2011  . Acquired hammer toe 05/24/2011  . Bunion 10/31/2010  . Aortic heart valve narrowing 10/02/2010  . Airway hyperreactivity 10/02/2010  . 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 BP (high blood pressure) 10/02/2010  . Obstructive apnea  10/02/2010  . Episode of syncope 10/02/2010  . 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 Myocardial infarction 10/02/2010  . Type 2 diabetes mellitus ([place]) 10/02/2010  . ASD (atrial septal defect), ostium secundum 06/29/2006  . Diabetes ([place]) 06/29/2006    Past Surgical History:  Procedure Laterality Date  . ABDOMINAL HYSTERECTOMY    . APPENDECTOMY    . bilateral tubal ligation    . BLADDER SURGERY     "repair"  . BREAST BIOPSY Right    negative 3 different biopsies  . BREAST SURGERY    . BUNIONECTOMY Bilateral   . CARDIAC CATHETERIZATION    . COLONOSCOPY N/A 12/06/2014   Procedure: COLONOSCOPY;  Surgeon: Manya Silvas, MD;  Location: [facility] ENDOSCOPY;  Service: Endoscopy;  Laterality: N/A;  . ESOPHAGOGASTRODUODENOSCOPY (EGD) WITH PROPOFOL N/A 02/14/2015   Procedure: ESOPHAGOGASTRODUODENOSCOPY (EGD) WITH PROPOFOL;  Surgeon: Manya Silvas, MD;  Location: [facility] ENDOSCOPY;  Service: Endoscopy;  Laterality: N/A;  . FOOT SURGERY Bilateral   . PELVIC FLOOR REPAIR    . TUBAL LIGATION      OB History    No data available       Home Medications    Prior to Admission medications   Medication Sig Start Date End Date Taking? Authorizing Provider  albuterol (PROAIR HFA) 108 (90 BASE) MCG/ACT inhaler Inhale 1-2 puffs into the lungs every 4 (four) hours as needed for wheezing or shortness of breath. 08/12/14   Bobetta Lime, MD  albuterol (PROVENTIL) (2.5 MG/3ML) 0.083% nebulizer solution Inhale into the lungs. Reported on 02/03/2015    Historical Provider, MD  allopurinol (ZYLOPRIM) 100 MG tablet Take 200 mg by mouth.     Historical Provider, MD  ammonium lactate (LAC-HYDRIN) 12 % lotion APPLY TO DAMP SKIN DAILY 09/09/14   Historical Provider, MD  amphetamine-dextroamphetamine (ADDERALL XR) 30 MG 24 hr capsule Take 1 capsule (30 mg total) by mouth daily. 01/17/16   Ward Givens, NP  amphetamine-dextroamphetamine (ADDERALL) 10 MG tablet Take 1 tablet (10 mg total) by mouth 2 (two) times  daily with a meal. 01/17/16   Ward Givens, NP  cephALEXin (KEFLEX) 500 MG capsule Take 1 capsule (500 mg total) by mouth 2 (two) times daily. 01/22/16   Harvest Dark, MD  Clobetasol Propionate 0.05 % shampoo  09/10/14   Historical Provider, MD  estradiol (ESTRACE) 0.5 MG tablet Take 0.5 mg by mouth daily.  06/13/14   Historical Provider, MD  fluticasone Asencion Islam) 50 MCG/ACT nasal spray instill 2 sprays into each nostril NIGHTLY 09/08/14   Historical Provider, MD  furosemide (LASIX) 40 MG tablet Take 40 mg by mouth daily. 09/15/14   Historical Provider, MD  gabapentin (NEURONTIN) 300 MG capsule Take 600 mg by mouth at bedtime. 06/21/14   Historical Provider, MD  levothyroxine (SYNTHROID, LEVOTHROID) 25 MCG tablet Take by mouth.    Historical Provider, MD  losartan (COZAAR) 50 MG tablet Take by mouth. 03/18/14   Historical Provider, MD  metoprolol succinate (TOPROL-XL) 50 MG 24 hr tablet Take 1 tablet (50 mg total) by mouth daily. 07/23/14  Bobetta Lime, MD  montelukast (SINGULAIR) 10 MG tablet  09/12/14   Historical Provider, MD  ondansetron (ZOFRAN ODT) 8 MG disintegrating tablet Take 1 tablet (8 mg total) by mouth every 8 (eight) hours as needed. 03/07/16   Norval Gable, MD  pantoprazole (PROTONIX) 40 MG tablet Take 40 mg by mouth daily. 01/07/15   Historical Provider, MD  pramipexole (MIRAPEX) 1 MG tablet  09/12/14   Historical Provider, MD  SYMBICORT 160-4.5 MCG/ACT inhaler inhale 2 puffs by mouth twice a day 12/08/14   Bobetta Lime, MD  traZODone (DESYREL) 50 MG tablet Take 1 tablet (50 mg total) by mouth at bedtime. 01/22/16   Harvest Dark, MD  tretinoin (RETIN-A) 0.05 % cream Apply topically at bedtime.  01/19/14   Historical Provider, MD    Family History Family History  Problem Relation Age of Onset  . Hypertension Mother   . Hypothyroidism Mother   . Heart disease Mother   . Lung cancer Mother   . Alcohol abuse Mother   . Depression Mother   . Hypertension Father   . Heart  disease Father   . Alcohol abuse Father   . Hypothyroidism Sister   . Hypertension Sister   . Bipolar disorder Sister   . Heart disease Brother   . Alcohol abuse Brother   . Bipolar disorder Brother   . Hypothyroidism Sister   . Hypertension Sister   . Breast cancer Neg Hx     Social History Social History  Substance Use Topics  . Smoking status: Never Smoker  . Smokeless tobacco: Never Used     Comment: tried smoking in youth  . Alcohol use No     Comment: used when she was 65 years old.      Allergies   Bee venom; Desvenlafaxine; Diltiazem hcl; Neomycin; Other; Pneumococcal polysaccharide vaccine; Prednisone; Tetracycline; Latex; Ampicillin; Erythromycin; Influenza vaccines; Lamotrigine; Penicillins; Pneumococcal vaccine; Septra [sulfamethoxazole-trimethoprim]; Buspirone; Cortisone; Imipramine; Statins; Tape; and Ziprasidone hcl   Review of Systems Review of Systems  Gastrointestinal: Positive for diarrhea.     Physical Exam Triage Vital Signs ED Triage Vitals  Enc Vitals Group     BP 03/07/16 1127 (!) 85/68     Pulse Rate 03/07/16 1127 60     Resp 03/07/16 1127 18     Temp 03/07/16 1127 97.5 F (36.4 C)     Temp Source 03/07/16 1127 Oral     SpO2 03/07/16 1127 100 %     Weight 03/07/16 1124 175 lb (79.4 kg)     Height 03/07/16 1124 5\' 3"  (1.6 m)     Head Circumference --      Peak Flow --      Pain Score 03/07/16 1127 4     Pain Loc --      Pain Edu? --      Excl. in [place]? --    No data found.   Updated Vital Signs BP (!) 103/56 (BP Location: Right Arm)   Pulse 63   Temp 97.5 F (36.4 C) (Oral)   Resp 16   Ht 5\' 3"  (1.6 m)   Wt 175 lb (79.4 kg)   SpO2 100%   BMI 31.00 kg/m   Visual Acuity Right Eye Distance:   Left Eye Distance:   Bilateral Distance:    Right Eye Near:   Left Eye Near:    Bilateral Near:     Physical Exam  Constitutional: She appears well-developed and well-nourished. No distress.  Cardiovascular: Normal rate, regular  rhythm, normal heart sounds and intact distal pulses.   Abdominal: Soft. Bowel sounds are normal. She exhibits no distension and no mass. There is no tenderness. There is no rebound and no guarding.  Skin: She is not diaphoretic.  Nursing note and vitals reviewed.    UC Treatments / Results  Labs (all labs ordered are listed, but only abnormal results are displayed) Labs Reviewed  CBC WITH DIFFERENTIAL/PLATELET - Abnormal; Notable for the following:       Result Value   RBC 3.65 (*)    Hemoglobin 11.4 (*)    HCT 34.3 (*)    RDW 15.1 (*)    All other components within normal limits  COMPREHENSIVE METABOLIC PANEL - Abnormal; Notable for the following:    Potassium 3.4 (*)    Glucose, Bld 111 (*)    BUN 43 (*)    Creatinine, Ser 1.90 (*)    GFR calc non Af Amer 27 (*)    GFR calc Af Amer 31 (*)    All other components within normal limits  URINALYSIS, COMPLETE (UACMP) WITH MICROSCOPIC - Abnormal; Notable for the following:    Squamous Epithelial / LPF 0-5 (*)    All other components within normal limits  MAGNESIUM    EKG  EKG Interpretation None       Radiology No results found.  Procedures ED EKG Date/Time: 03/07/2016 3:59 PM Performed by: Norval Gable Authorized by: Norval Gable   ECG reviewed by ED Physician in the absence of a cardiologist: yes   Previous ECG:    Previous ECG:  Compared to current   Similarity:  No change Interpretation:    Interpretation: abnormal   Rate:    ECG rate:  59   ECG rate assessment: bradycardic   Rhythm:    Rhythm: sinus bradycardia   Ectopy:    Ectopy: none   QRS:    QRS axis:  Normal Conduction:    Conduction: normal   ST segments:    ST segments:  Normal T waves:    T waves: normal     (including critical care time)  Medications Ordered in UC Medications  ondansetron (ZOFRAN-ODT) disintegrating tablet 8 mg (8 mg Oral Given 03/07/16 1251)  potassium chloride SA (K-DUR,KLOR-CON) CR tablet 20 mEq (20 mEq Oral  Given 03/07/16 1409)     Initial Impression / Assessment and Plan / UC Course  I have reviewed the triage vital signs and the nursing notes.  Pertinent labs & imaging results that were available during my care of the patient were reviewed by me and considered in my medical decision making (see chart for details).       Final Clinical Impressions(s) / UC Diagnoses   Final diagnoses:  Gastroenteritis, acute  Hypokalemia    New Prescriptions Discharge Medication List as of 03/07/2016  2:30 PM    START taking these medications   Details  ondansetron (ZOFRAN ODT) 8 MG disintegrating tablet Take 1 tablet (8 mg total) by mouth every 8 (eight) hours as needed., Starting Wed 03/07/2016, Normal       1. Lab results and diagnosis reviewed with patient 2.patient given zofran 8mg  odt x 1 with improvement of symptoms and tolerating po fluids prior to discharge 3. rx as per orders above; reviewed possible side effects, interactions, risks and benefits  3. Recommend supportive treatment with increased fluids; hold Lasix today 4. Follow-up prn if symptoms worsen or don't improve   Norval Gable, MD 03/07/16 [phone]

## 2016-05-05 ENCOUNTER — Ambulatory Visit (INDEPENDENT_AMBULATORY_CARE_PROVIDER_SITE_OTHER): Payer: Medicare HMO

## 2016-05-05 ENCOUNTER — Encounter: Payer: Self-pay | Admitting: Gynecology

## 2016-05-05 ENCOUNTER — Ambulatory Visit
Admission: EM | Admit: 2016-05-05 | Discharge: 2016-05-05 | Disposition: A | Discharge status: Home / Self Care | Payer: Medicare HMO | Attending: Family Medicine | Admitting: Family Medicine

## 2016-05-05 DIAGNOSIS — R059 Cough, unspecified: Secondary | ICD-10-CM

## 2016-05-05 DIAGNOSIS — J40 Bronchitis, not specified as acute or chronic: Secondary | ICD-10-CM | POA: Diagnosis not present

## 2016-05-05 DIAGNOSIS — R0602 Shortness of breath: Secondary | ICD-10-CM

## 2016-05-05 DIAGNOSIS — J209 Acute bronchitis, unspecified: Secondary | ICD-10-CM

## 2016-05-05 DIAGNOSIS — R05 Cough: Secondary | ICD-10-CM | POA: Diagnosis not present

## 2016-05-05 MED ORDER — IPRATROPIUM-ALBUTEROL 0.5-2.5 (3) MG/3ML IN SOLN
3.0000 mL | Freq: Once | RESPIRATORY_TRACT | Status: AC
  Administered 2016-05-05: 3 mL via RESPIRATORY_TRACT

## 2016-05-05 NOTE — ED Triage Notes (Signed)
Per patient x 2 weeks with cough and congestion. Per patient seen on the 4/21 and 4/23 at [facility] urgent care and was given a z-pak and 2 dosage of prednisone but still not getter any better.

## 2016-05-05 NOTE — ED Provider Notes (Signed)
CSN: [id number]     Arrival date & time 05/05/16  5364 History   First MD Initiated Contact with Patient 05/05/16 1023     Chief Complaint  Patient presents with  . Cough   (Consider location/radiation/quality/duration/timing/severity/associated sxs/prior Treatment) HPI  This a 66 year old female who presents with cough and congestion for 2 weeks. Was seen on 4/21 and 4/23 at [facility] clinic urgent care and prescribed a Z-Pak which she completed ann 2 doses of prednisone. She did not improve and is actually needing to cough although she states that she is "some better". She is accompanied by her daughter who insisted that she be seen today. At the present time the patient is coughing incessantly. Her O2 sats are 100% ,respirations 16, temperature 90.8 ,pulse 72 and blood pressure 140/63. She has a history of chronic asthma, COPD although she disputes this, Type 2 diabetes, obstructive sleep apnea, and numerous other comorbidities listed in her problem list. She states that she has used her nebulizer this morning as well as her other inhaler. Despite this she continues to have the cough. She denies any fevers or chills.      Past Medical History:  Diagnosis Date  . Adult attention deficit disorder   . Anemia   . Bipolar disorder ([place])   . Cancer ([place])    skin  . CHF NYHA class I (HCC)    chronic, diastolic  . Chronic asthma   . Chronic renal impairment, stage 3 (moderate)   . COPD (chronic obstructive pulmonary disease) ([place])   . Coronary artery disease with history of myocardial infarction without history of CABG   . Depression   . Diabetes mellitus without complication ([place])   . Dysrhythmia   . Fibromyalgia   . Fibromyalgia   . GERD without esophagitis   . Gout   . Gout   . Heart murmur   . Hepatitis B   . Hepatitis C   . History of chronic hepatitis    Hepatitis B and C  . Hyperlipidemia   . Hypertension   . Hypothyroidism, adult   . Narcolepsy due to underlying  condition without cataplexy   . Obstructive sleep apnea, adult    CPAP  . 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 Patent foramen ovale   . Postural orthostatic tachycardia syndrome   . Raynaud's disease   . Raynaud's disease without gangrene   . Renal insufficiency    2008 per pt  . Restless leg syndrome, controlled   . Rosacea   . Secondary hyperparathyroidism, renal ([place])   . Statin intolerance   . Surgical menopause on hormone replacement therapy   . Syncope and collapse    Past Surgical History:  Procedure Laterality Date  . ABDOMINAL HYSTERECTOMY    . APPENDECTOMY    . bilateral tubal ligation    . BLADDER SURGERY     "repair"  . BREAST BIOPSY Right    negative 3 different biopsies  . BREAST SURGERY    . BUNIONECTOMY Bilateral   . CARDIAC CATHETERIZATION    . COLONOSCOPY N/A 12/06/2014   Procedure: COLONOSCOPY;  Surgeon: Manya Silvas, MD;  Location: [facility] ENDOSCOPY;  Service: Endoscopy;  Laterality: N/A;  . ESOPHAGOGASTRODUODENOSCOPY (EGD) WITH PROPOFOL N/A 02/14/2015   Procedure: ESOPHAGOGASTRODUODENOSCOPY (EGD) WITH PROPOFOL;  Surgeon: Manya Silvas, MD;  Location: [facility] ENDOSCOPY;  Service: Endoscopy;  Laterality: N/A;  . FOOT SURGERY Bilateral   . PELVIC FLOOR REPAIR    . TUBAL LIGATION  Family History  Problem Relation Age of Onset  . Hypertension Mother   . Hypothyroidism Mother   . Heart disease Mother   . Lung cancer Mother   . Alcohol abuse Mother   . Depression Mother   . Hypertension Father   . Heart disease Father   . Alcohol abuse Father   . Hypothyroidism Sister   . Hypertension Sister   . Bipolar disorder Sister   . Heart disease Brother   . Alcohol abuse Brother   . Bipolar disorder Brother   . Hypothyroidism Sister   . Hypertension Sister   . Breast cancer Neg Hx    Social History  Substance Use Topics  . Smoking status: Never Smoker  . Smokeless tobacco: Never Used     Comment: tried smoking in youth   . Alcohol use No     Comment: used when she was 66 years old.    OB History    No data available     Review of Systems  Constitutional: Positive for activity change and fatigue. Negative for chills and fever.  Respiratory: Positive for cough, shortness of breath and wheezing.   All other systems reviewed and are negative.   Allergies  Bee venom; Desvenlafaxine; Diltiazem hcl; Neomycin; Other; Pneumococcal polysaccharide vaccine; Prednisone; Tetracycline; Latex; Ampicillin; Erythromycin; Influenza vaccines; Lamotrigine; Penicillins; Pneumococcal vaccine; Septra [sulfamethoxazole-trimethoprim]; Buspirone; Cortisone; Imipramine; Statins; Tape; and Ziprasidone hcl  Home Medications   Prior to Admission medications   Medication Sig Start Date End Date Taking? Authorizing Provider  albuterol (PROAIR HFA) 108 (90 BASE) MCG/ACT inhaler Inhale 1-2 puffs into the lungs every 4 (four) hours as needed for wheezing or shortness of breath. 08/12/14  Yes Bobetta Lime, MD  albuterol (PROVENTIL) (2.5 MG/3ML) 0.083% nebulizer solution Inhale into the lungs. Reported on 02/03/2015   Yes Historical Provider, MD  allopurinol (ZYLOPRIM) 100 MG tablet Take 200 mg by mouth.    Yes Historical Provider, MD  ammonium lactate (LAC-HYDRIN) 12 % lotion APPLY TO DAMP SKIN DAILY 09/09/14  Yes Historical Provider, MD  amphetamine-dextroamphetamine (ADDERALL XR) 30 MG 24 hr capsule Take 1 capsule (30 mg total) by mouth daily. 01/17/16  Yes Ward Givens, NP  amphetamine-dextroamphetamine (ADDERALL) 10 MG tablet Take 1 tablet (10 mg total) by mouth 2 (two) times daily with a meal. 01/17/16  Yes Ward Givens, NP  cephALEXin (KEFLEX) 500 MG capsule Take 1 capsule (500 mg total) by mouth 2 (two) times daily. 01/22/16  Yes Harvest Dark, MD  Clobetasol Propionate 0.05 % shampoo  09/10/14  Yes Historical Provider, MD  estradiol (ESTRACE) 0.5 MG tablet Take 0.5 mg by mouth daily.  06/13/14  Yes Historical Provider, MD   fluticasone (FLONASE) 50 MCG/ACT nasal spray instill 2 sprays into each nostril NIGHTLY 09/08/14  Yes Historical Provider, MD  furosemide (LASIX) 40 MG tablet Take 40 mg by mouth daily. 09/15/14  Yes Historical Provider, MD  gabapentin (NEURONTIN) 300 MG capsule Take 600 mg by mouth at bedtime. 06/21/14  Yes Historical Provider, MD  levothyroxine (SYNTHROID, LEVOTHROID) 25 MCG tablet Take by mouth.   Yes Historical Provider, MD  losartan (COZAAR) 50 MG tablet Take by mouth. 03/18/14  Yes Historical Provider, MD  metoprolol succinate (TOPROL-XL) 50 MG 24 hr tablet Take 1 tablet (50 mg total) by mouth daily. 07/23/14  Yes Bobetta Lime, MD  montelukast (SINGULAIR) 10 MG tablet  09/12/14  Yes Historical Provider, MD  ondansetron (ZOFRAN ODT) 8 MG disintegrating tablet Take 1 tablet (8 mg total) by mouth  every 8 (eight) hours as needed. 03/07/16  Yes Norval Gable, MD  pantoprazole (PROTONIX) 40 MG tablet Take 40 mg by mouth daily. 01/07/15  Yes Historical Provider, MD  pramipexole (MIRAPEX) 1 MG tablet  09/12/14  Yes Historical Provider, MD  SYMBICORT 160-4.5 MCG/ACT inhaler inhale 2 puffs by mouth twice a day 12/08/14  Yes Bobetta Lime, MD  traZODone (DESYREL) 50 MG tablet Take 1 tablet (50 mg total) by mouth at bedtime. 01/22/16  Yes Harvest Dark, MD  tretinoin (RETIN-A) 0.05 % cream Apply topically at bedtime.  01/19/14  Yes Historical Provider, MD   Meds Ordered and Administered this Visit   Medications  ipratropium-albuterol (DUONEB) 0.5-2.5 (3) MG/3ML nebulizer solution 3 mL (3 mLs Nebulization Given 05/05/16 1042)    BP 140/63 (BP Location: Left Arm)   Pulse 72   Temp 98 F (36.7 C) (Oral)   Resp 16   Ht 5\' 3"  (1.6 m)   Wt 175 lb (79.4 kg)   SpO2 100%   BMI 31.00 kg/m  No data found.   Physical Exam  Constitutional: She is oriented to person, place, and time. She appears well-developed and well-nourished. No distress.  HENT:  Head: Normocephalic and atraumatic.  Right Ear:  External ear normal.  Left Ear: External ear normal.  Nose: Nose normal.  Mouth/Throat: Oropharynx is clear and moist. No oropharyngeal exudate.  Eyes: Pupils are equal, round, and reactive to light. Right eye exhibits no discharge. Left eye exhibits no discharge.  Neck: Normal range of motion.  Pulmonary/Chest: Effort normal. No stridor. No respiratory distress. She has wheezes. She has rales.  Patient has crackles in the left base. They are non-Tussive. Patient is not in respiratory distress at all.  Musculoskeletal: Normal range of motion.  Lymphadenopathy:    She has no cervical adenopathy.  Neurological: She is alert and oriented to person, place, and time.  Skin: Skin is warm and dry. She is not diaphoretic.  Psychiatric: She has a normal mood and affect. Her behavior is normal. Judgment and thought content normal.  Nursing note and vitals reviewed.   Urgent Care Course     Procedures (including critical care time)  Labs Review Labs Reviewed - No data to display  Imaging Review Dg Chest 2 View  Result Date: 05/05/2016 CLINICAL DATA:  Cough with left lower lobe rales EXAM: CHEST  2 VIEW COMPARISON:  October 17, 2009 FINDINGS: Lungs are clear. Heart size and pulmonary vascularity are normal. No adenopathy. There is mild degenerative change in the thoracic spine. IMPRESSION: No edema or consolidation. Electronically Signed   By: Lowella Grip III M.D.   On: 05/05/2016 11:16     Visual Acuity Review  Right Eye Distance:   Left Eye Distance:   Bilateral Distance:    Right Eye Near:   Left Eye Near:    Bilateral Near:     Medications  ipratropium-albuterol (DUONEB) 0.5-2.5 (3) MG/3ML nebulizer solution 3 mL (3 mLs Nebulization Given 05/05/16 1042)     MDM   1. Cough   2. Bronchitis    Discharge Medication List as of 05/05/2016 11:43 AM    Long discussion with the patient and her daughter. I reviewed her medical records from her visits on 4/21 and 4/23 at the  [facility] clinic. She continues to have her cough despite use of the azithromycin and 2 different glucocorticoids; first of Decadron which she apparently had an adverse reaction to and then later to the [place]. She states that she  is doing better but coughs incessantly during the examination. She exhibited wheezing when first seen although her O2 sats were 100% and she was not tachypneic and following the DuoNeb treatment was improved with her lung sounds less tight and less coughing. I do not believe that additional antibiotics are indicated this time. She is also had variable success with different glucocorticoids showing allergic reaction to prednisone and Decadron but able to recently tolerate the Medrol. I recommend highly that she be seen by a pulmonologist and they will make that arrangement on Monday. In the meantime she should increase the use of her albuterol inhalers 2 puffs every 6 hours. She responded very nicely to the [facility] treatment here and hopefully this will continue with use of her inhalers more frequently. I also recommended a antihistamine decongestant combination, and careful monitoring of her blood pressures. If she worsens she should go immediately to the emergency department.    Lorin Picket, PA-C 05/05/16 1247

## 2016-07-24 ENCOUNTER — Ambulatory Visit: Payer: Medicare HMO | Admitting: Neurology

## 2016-09-04 ENCOUNTER — Encounter: Payer: Self-pay | Admitting: Neurology

## 2016-09-06 ENCOUNTER — Encounter: Payer: Self-pay | Admitting: Neurology

## 2016-09-06 ENCOUNTER — Ambulatory Visit (INDEPENDENT_AMBULATORY_CARE_PROVIDER_SITE_OTHER): Payer: Medicare HMO | Admitting: Neurology

## 2016-09-06 VITALS — BP 111/65 | HR 71 | Ht 62.0 in | Wt 166.0 lb

## 2016-09-06 DIAGNOSIS — G4719 Other hypersomnia: Secondary | ICD-10-CM | POA: Diagnosis not present

## 2016-09-06 DIAGNOSIS — G47411 Narcolepsy with cataplexy: Secondary | ICD-10-CM | POA: Diagnosis not present

## 2016-09-06 MED ORDER — MODAFINIL 200 MG PO TABS: 200.0000 mg | ORAL_TABLET | Freq: Every day | ORAL | 5 refills | Status: DC

## 2016-09-06 NOTE — Patient Instructions (Addendum)
Preventing Motor Vehicle Crashes, Adult Driving is important for many people, but it can be very dangerous without using safe driving practices. Every year, millions of people are injured and thousands die in motor vehicle accidents. Motor vehicle crashes are one of the leading causes of death and injury in the [place] You can be safe while driving and reduce your risk of an accident by taking certain simple actions. Why does preventing motor vehicle crashes matter?  A motor vehicle crash can have a huge impact on your life and your family. It can affect your physical and emotional health and your finances. After an accident, you may have to miss work, and your auto insurance rates may increase.  A motor vehicle crash can kill or injure anyone in the car or on the road, including innocent bystanders.  Many deaths and injuries could be avoided every year if more drivers took steps to prevent motor vehicle accidents. What changes can be made to prevent motor vehicle crashes?  Do not drive after drinking alcohol or using drugs. This includes some prescription and over-the-counter medicines that can make you drowsy or cause delayed reaction times. If you are taking prescription medicines, ask your health care provider if it is safe for you to drive.  Always wear a seat belt. The easiest way to prevent serious injuries or death from a car crash is to wear a seat belt every time you are in a car.  Before you start driving: ? Choose your radio station and leave the radio on that station until you arrive at your destination. ? Set your navigation system so you do not have to use it while driving.  Use a car seat or booster seat for young children. Make sure that these seats are installed correctly and are the right size for the child's age and weight.  Do not use a cell phone or any other digital device while driving. Do not text while driving.  Obey speed limits and other traffic laws at all times. Do  notspeed.  Pay close attention to road conditions. Slow down when there is rain, snow, or icy roads.  Do not eat or drink while driving.  Be alert and cautious of those around you while driving. Give other drivers plenty of space. What can happen if changes are not made? The personal and economic costs of motor vehicle accidents can be high. Be aware that:  Driving while under the influence of alcohol or drugs greatly increases your chance of having an accident. There could also be consequences such as fines or jail time.  Not using seat belts or the correct car seat for children greatly increases the risk of serious injury or death from an accident.  Distracted driving, such as eating, talking, or texting on the phone while driving, greatly increases your chance of having an accident.  What can I do to protect myself while driving or riding in a car?  Always wear your seat belt.  Stay aware of your surroundings. If you notice a dangerous driver, give that person plenty of space on the road or choose an alternate route, if possible.  Do not use a cell phone or any other digital device while driving. If you are riding in a car and the driver is using a phone, tell him or her to stop.  Do not drive after drinking alcohol, even after having just one drink. Do not ride in a car with a driver who has been drinking alcohol. Try   to stop others from driving after drinking. Where to find more information:  Centers for Disease Control and Prevention: BadVenus.com.cy.html  Occupational Health and Safety Administration: TipCertified.com.pt.pdf  Association of State and Territorial Health Officials: www.travelstabloid.com Summary  Motor vehicle crashes are one of the leading causes of death and injury in the [place]  Many crashes and  serious injuries can be avoided with simple changes in driving behavior.  To stay safe while driving, it is important to follow traffic laws and speed limits and use seat belts and car seats.  Never drive after drinking any alcohol or using drugs or medicines that make you drowsy or alter your reaction time.  Avoid distractions while driving, such as cell phones and food. This information is not intended to replace advice given to you by your health care provider. Make sure you discuss any questions you have with your health care provider. Document Released: 09/09/2015 Document Revised: 09/09/2015 Document Reviewed: 09/09/2015 Elsevier Interactive Patient Education  2018 [employer]. Modafinil tablets What is this medicine? MODAFINIL (moe DAF i nil) is used to treat excessive sleepiness caused by certain sleep disorders. This includes narcolepsy, sleep apnea, and shift work sleep disorder. This medicine may be used for other purposes; ask your health care provider or pharmacist if you have questions. COMMON BRAND NAME(S): Provigil What should I tell my health care provider before I take this medicine? They need to know if you have any of these conditions: -history of depression, mania, or other mental disorder -kidney disease -liver disease -an unusual or allergic reaction to modafinil, other medicines, foods, dyes, or preservatives -pregnant or trying to get pregnant -breast-feeding How should I use this medicine? Take this medicine by mouth with a glass of water. Follow the directions on the prescription label. Take your doses at regular intervals. Do not take your medicine more often than directed. Do not stop taking except on your doctor's advice. A special MedGuide will be given to you by the pharmacist with each prescription and refill. Be sure to read this information carefully each time. Talk to your pediatrician regarding the use of this medicine in children. This medicine is not  approved for use in children. Overdosage: If you think you have taken too much of this medicine contact a poison control center or emergency room at once. NOTE: This medicine is only for you. Do not share this medicine with others. What if I miss a dose? If you miss a dose, take it as soon as you can. If it is almost time for your next dose, take only that dose. Do not take double or extra doses. What may interact with this medicine? Do not take this medicine with any of the following medications: -amphetamine or dextroamphetamine -dexmethylphenidate or methylphenidate -medicines called MAO Inhibitors like Nardil, Parnate, Marplan, Eldepryl -pemoline -procarbazine This medicine may also interact with the following medications: -antifungal medicines like itraconazole or ketoconazole -barbiturates like phenobarbital -birth control pills or other hormone-containing birth control devices or implants -carbamazepine -cyclosporine -diazepam -medicines for depression, anxiety, or psychotic disturbances -phenytoin -propranolol -triazolam -warfarin This list may not describe all possible interactions. Give your health care provider a list of all the medicines, herbs, non-prescription drugs, or dietary supplements you use. Also tell them if you smoke, drink alcohol, or use illegal drugs. Some items may interact with your medicine. What should I watch for while using this medicine? Visit your doctor or health care professional for regular checks on your progress. The full effects of this  medicine may not be seen right away. This medicine may affect your concentration, function, or may hide signs that you are tired. You may get dizzy. Do not drive, use machinery, or do anything that needs mental alertness until you know how this drug affects you. Alcohol can make you more dizzy and may interfere with your response to this medicine or your alertness. Avoid alcoholic drinks. Birth control pills may not  work properly while you are taking this medicine. Talk to your doctor about using an extra method of birth control. It is unknown if the effects of this medicine will be increased by the use of caffeine. Caffeine is available in many foods, beverages, and medications. Ask your doctor if you should limit or change your intake of caffeine-containing products while on this medicine. What side effects may I notice from receiving this medicine? Side effects that you should report to your doctor or health care professional as soon as possible: -allergic reactions like skin rash, itching or hives, swelling of the face, lips, or tongue -anxiety -breathing problems -chest pain -fast, irregular heartbeat -hallucinations -increased blood pressure -redness, blistering, peeling or loosening of the skin, including inside the mouth -sore throat, fever, or chills -suicidal thoughts or other mood changes -tremors -vomiting Side effects that usually do not require medical attention (report to your doctor or health care professional if they continue or are bothersome): -headache -nausea, diarrhea, or stomach upset -nervousness -trouble sleeping This list may not describe all possible side effects. Call your doctor for medical advice about side effects. You may report side effects to FDA at 1-800-FDA-1088. Where should I keep my medicine? Keep out of the reach of children. This medicine can be abused. Keep your medicine in a safe place to protect it from theft. Do not share this medicine with anyone. Selling or giving away this medicine is dangerous and against the law. This medicine may cause accidental overdose and death if taken by other adults, children, or pets. Mix any unused medicine with a substance like cat litter or coffee grounds. Then throw the medicine away in a sealed container like a sealed bag or a coffee can with a lid. Do not use the medicine after the expiration date. Store at room  temperature between 20 and 25 degrees C (68 and 77 degrees F). NOTE: This sheet is a summary. It may not cover all possible information. If you have questions about this medicine, talk to your doctor, pharmacist, or health care provider.  2018 Elsevier/Gold Standard (2013-09-15 15:34:55)

## 2016-09-06 NOTE — Progress Notes (Signed)
PATIENT: Jamie Murillo DOB: [birth year]  REASON FOR VISIT: follow up- daytime sleepiness, obstructive sleep apnea on CPAP HISTORY FROM: patient  HISTORY OF PRESENT ILLNESS:  Interval history from 09/06/2016. Jamie Murillo reports that her husband is working a late shift and she now goes to bed after midnight sometimes at one or 2 AM but she rises in the morning for early mass about 5:30. This is reflected in her CPAP use. She is used to machine 28 out of 30 days with a compliance of 93%, but she only managed 6 days of 4 hours or more of use. She is continuing to use an AutoSet between 5 and 15 cm water with 2 cm EPR, she does have significant air leaks but her residual AHI is 3.4 and sufficient. I am sorry that she just doesn't get enough sleep to repeat the benefits of apnea therapy. She states that her husband comes home late and she would wake up and he enters the bedroom. I'm not sure what to offer her, other than establishing a sleep routine with a bedtime around 10 PM to locate at least 7 hours of sleep. Her fatigue is very high at 52 points, her Epworth sleepiness score is excessive at 23 out of 24 points, I would question safety when driving or operating machinery with this degree of sleepiness. The geriatric depression score was endorsed at only 3 out of 15 points.   Today 01/17/2016: Jamie Murillo is a 66 year old female with a history of daytime sleepiness and obstructive sleep apnea on CPAP. She returns today for an evaluation. Her download indicates that she used her machine 29 out of 30 days for compliance of 97%. She uses her machine greater than 4 hours 15 out of 30 days for compliance of 50%. On average she uses her machine4 hours and 10 minutes. She has a residual AHI of 2.1 on a minimum pressure of 5 cm of water and maximum pressure 15 cm water with EPR of 2. The patient does have a leak in the 95th percentile at 54.4 L/m. She reports that she has not changed her mask, headgear or  tubing out in over a year. She states that she does feel it leaking at night. She continues to have daytime sleepiness. Her Epworth sleepiness score is 16 and fatigue severity score is 54. She states that she does not take Adderall consistently. She currently has Adderall XR 30 mg but she states she does not take it daily. She also has Adderall immediate release 10 mg that she takes as needed. She returns today for an evaluation.  HISTORY per Dr. Edwena Felty notes: Jamie Murillo is a 66 y.o. female , seen here as a referral from Dr. Doy Hutching . HX CD- Jamie Murillo has used CPAP since 2011-  the studies I have available here are from 2016 and show that she is  excessively daytime sleepy while  being sufficiently treated on CPAP. The CPAP interpretation showed a sleep time of 376.5 minutes , a sleep latency of 15 minutes, a sleep efficiency of only 81.1% , REM proportion was 28.3% and the AHI was only 5.4 per hour, the RDI was 6.4 per hour.  During REM sleep AHI  was increased to 10.1 and in supine position 5.4 AHI. The patient had borderline bradycardia , with an average heart rate was 68. BPM  however. There were no periodic limb movements . She was titrated to 9 cm water pressure she also was tried on 10 cm water  pressure and the technologist noted that she seemed to do better.  It was recommended to use 10 cm water pressure CPAP as the most optimal pressure for her. Patient then was required to stay the next day on 01-20-14 for an MS LT . Interestingly , she was given 5 nap opportunities sleep onsets were supposedly within less than a minute in each of her periods and she had REM onset in 4 out of 5 naps. The average sleep latency was 0.5 minutes and see for naps containing REM were diagnostic for the  diagnosis of narcolepsy . Patient was treated with CPAP prior to the MS LT , no need to re-titrate her.  Patient goes to bed between 10 and 11 PM, adheres to sleep hygiene rules. She sleeeps supine, because of  the CPAP . She was never tried on a nasal pillow.  She sleeps in a cool, quiet, and dark room, uses CPAP every night/  Sometimes she will find her self sleep walking, eating, . She will get up about 4.30 to help her husband to get ready, she is retired.  She struggles all day with hypersomnia. She drinks 2 caffeineated sodas. Ice tea. No ETOH , no tobacco use.  She does not take scheduled naps, and had insomnia before. The current degree of sleepiness begun 3- years ago. In the last 18 month she gained 35 pounds!   Interval history from 09-02-14-I still have no access to a baseline sleep study for this patient, and I'm given data from as early as 2016 reflecting a CPAP titration only but no baseline study. Given the difficulties we have to control the patient's excessive daytime sleepiness, she still endorses the Epworth Sleepiness Scale at 22 points and the fatigue severity scale at 61 points, it would have been helpful to at least obtain a download. These data are not available from her machine either. She is using currently a full face mask, AIR fit F , 10 by [employer]. Given her reported history of imipramine allergy she was not able to take Tofranil she stated. So I have no cataplexy controlling medication for this patient. She carries a diagnosis of ADD and the use of stimulants can treat both excessive daytime sleepiness and ADD, but it is not an optimal treatment option for a lady of 66 years of age. Especially with heart disease  Blood pressure, obesity etc.  Interval history from 11-03-14. Jamie Murillo underwent a sleep study a baseline polysomnography on 10-17-14.patient carries a diagnosis of narcolepsy and always a had been re-titrated to a CPAP machine , but I had no access to at baseline diagnostic sleep study.  For this reason the new sleep study was ordered. The patient's sleep latency was 34 minutes REM latency was 62.5 minutes sleep efficiency was 88.4 her AHI was 11.8 which constitutes  mild sleep apnea. Nadir of oxygen was 75% but was only 21 minutes total of desaturation time. She did not retain CO2 above 50 torr, the study revealed that obstructive sleep apnea was still present and given the patient's very high degree of sleepiness should be treated. I advised to proceed with an hour told titrate her. It was only yesterday October 25 that we get the green light [facility] and they will provide an auto titrate a setting from 5-15 cm pressure window. She can use any interface to her comfort. She has been using full face masks only in  the past. She would like to continue.   Mrs.  Disla doesn't drive  Her second sleep disorder narcolepsy had been diagnosed in MSLT  daytime studies. The medication usually used to treat narcolepsy and is related sleepiness bears however several risks for this patient. She has a history of heart disease and she is in stage III chronic kidney disease. We discussed today the use of a stimulant such as Ritalin or Adderall which will make it easier for her to stay awake in daytime. I am not optimistic that it will reduce her Epworth sleepiness score below 10 but it may work temporarily and give her a predictable time window to operate machinery or car. She endorsed today the Epworth score at 22 points and the fatigue severity score at 55 points. The geriatric depression score was endorsed at 2 points. My plan is to have Mrs. Cranfield use the outflow CPAP and see her after 30 days of using the machine. We will then reobtain an Epworth sleepiness score. In the meantime I will order a very low dose of Ritalin for her, to allow her more wakefulness in daytime. Based on her renal disease and heart condition it is not advisable to treat this patient with Xyrem due to the high sodium load.  Interval history from 05/31/2015 Mrs. Pinnock is here for a compliance visit on CPAP. She has used the machine 30 out of 30 days but her average sleep time with the machine  is only 3 hours and 8 minutes. She assured me that this is all she sleeps. She is using an AutoSet between 5 and 15 cm water with 2 cm EPR. Her residual AHI is only 3.0. The 95th percentile pressure is 12.6 and the needs to be no adjustments made. I was asked the patient to put the CPAP on an hour before her bedtime if he would fall asleep and that time we have gained an hour of documented sleep time -if not-her compliance record would still benefit The also obtained a new sleepiness score at the patient still endorsed 22 points on her Epworth score 56 on fatigue severity and 4 on geriatric depression.  REVIEW OF SYSTEMS: Out of a complete 14 system review of symptoms, the patient complains only of the following symptoms, and all other reviewed systems are negative.  Eye discharge, eye redness, eye itching, light sensitivity, cough, leg swelling, fatigue, etc. unexpected weight change, apnea, frequent waking, daytime sleepiness, snoring, sleep talking, sleep walking, acting out dreams, neck pain, neck stiffness, rash, muscle cramps, urgency, anemia, memory loss, decreased concentration  ALLERGIES: Allergies  Allergen Reactions  . Bee Venom Anaphylaxis  . Desvenlafaxine Rash, Shortness Of Breath and Anaphylaxis  . Diltiazem Hcl Anaphylaxis  . Neomycin Anaphylaxis  . Other Anaphylaxis    pistacchios  . Pneumococcal Polysaccharide Vaccine Dermatitis, Rash and Swelling  . Prednisone Anaphylaxis  . Tetracycline Anaphylaxis  . Latex Rash  . Ampicillin Hives    Then anaphylaxsis  . Erythromycin Hives    Then anaphylaxsis  . Influenza Vaccines     Patient has a history of anaphylaxis to neomycin and other agents of that class.    . Lamotrigine Hives  . Penicillins Nausea And Vomiting  . Pneumococcal Vaccine Hives  . Septra [Sulfamethoxazole-Trimethoprim]     Leads to anaphylaxsis  . Buspirone Rash    Leads to anaphylaxsis  . Cortisone Rash  . Imipramine Rash  . Statins Rash    Statin  induced myopathy  . Tape Rash    BUSPAR. BUSPAR SEPTRA. SEPTRA  . Ziprasidone Hcl Rash  HOME MEDICATIONS: Outpatient Medications Prior to Visit  Medication Sig Dispense Refill  . albuterol (PROAIR HFA) 108 (90 BASE) MCG/ACT inhaler Inhale 1-2 puffs into the lungs every 4 (four) hours as needed for wheezing or shortness of breath. 1 Inhaler 3  . albuterol (PROVENTIL) (2.5 MG/3ML) 0.083% nebulizer solution Inhale into the lungs. Reported on 02/03/2015    . allopurinol (ZYLOPRIM) 100 MG tablet Take 200 mg by mouth.     Marland Kitchen ammonium lactate (LAC-HYDRIN) 12 % lotion APPLY TO DAMP SKIN DAILY  1  . Clobetasol Propionate 0.05 % shampoo   0  . estradiol (ESTRACE) 0.5 MG tablet Take 0.5 mg by mouth daily.   0  . fluticasone (FLONASE) 50 MCG/ACT nasal spray instill 2 sprays into each nostril NIGHTLY  0  . furosemide (LASIX) 40 MG tablet Take 40 mg by mouth daily.  0  . gabapentin (NEURONTIN) 300 MG capsule Take 600 mg by mouth at bedtime.  0  . levothyroxine (SYNTHROID, LEVOTHROID) 25 MCG tablet Take by mouth.    . losartan (COZAAR) 50 MG tablet Take by mouth.    . metoprolol succinate (TOPROL-XL) 50 MG 24 hr tablet Take 1 tablet (50 mg total) by mouth daily. 90 tablet 3  . montelukast (SINGULAIR) 10 MG tablet   0  . pantoprazole (PROTONIX) 40 MG tablet Take 40 mg by mouth daily.  0  . pramipexole (MIRAPEX) 1 MG tablet   0  . SYMBICORT 160-4.5 MCG/ACT inhaler inhale 2 puffs by mouth twice a day 10.2 Inhaler 2  . tretinoin (RETIN-A) 0.05 % cream Apply topically at bedtime.     Marland Kitchen amphetamine-dextroamphetamine (ADDERALL XR) 30 MG 24 hr capsule Take 1 capsule (30 mg total) by mouth daily. 30 capsule 0  . amphetamine-dextroamphetamine (ADDERALL) 10 MG tablet Take 1 tablet (10 mg total) by mouth 2 (two) times daily with a meal. 60 tablet 0  . cephALEXin (KEFLEX) 500 MG capsule Take 1 capsule (500 mg total) by mouth 2 (two) times daily. 14 capsule 0  . ondansetron (ZOFRAN ODT) 8 MG disintegrating tablet  Take 1 tablet (8 mg total) by mouth every 8 (eight) hours as needed. 6 tablet 0  . traZODone (DESYREL) 50 MG tablet Take 1 tablet (50 mg total) by mouth at bedtime. 30 tablet 0   No facility-administered medications prior to visit.     PAST MEDICAL HISTORY: Past Medical History:  Diagnosis Date  . Adult attention deficit disorder   . Anemia   . Bipolar disorder ([place])   . Cancer ([place])    skin  . CHF NYHA class I ([facility])    chronic, diastolic  . Chronic asthma   . Chronic renal impairment, stage 3 (moderate)   . COPD (chronic obstructive pulmonary disease) ([place])   . Coronary artery disease with history of myocardial infarction without history of CABG   . Depression   . Diabetes mellitus without complication ([place])   . Dysrhythmia   . Fibromyalgia   . Fibromyalgia   . GERD without esophagitis   . Gout   . Gout   . Heart murmur   . Hepatitis B   . Hepatitis C   . History of chronic hepatitis    Hepatitis B and C  . Hyperlipidemia   . Hypertension   . Hypothyroidism, adult   . 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 Obstructive sleep apnea, adult    CPAP  . 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  Patent foramen ovale   . Postural orthostatic tachycardia syndrome   . Raynaud's disease   . Raynaud's disease without gangrene   . Renal insufficiency    2008 per pt  . Restless leg syndrome, controlled   . Rosacea   . Secondary hyperparathyroidism, renal ([place])   . Statin intolerance   . Surgical menopause on hormone replacement therapy   . Syncope and collapse     PAST SURGICAL HISTORY: Past Surgical History:  Procedure Laterality Date  . ABDOMINAL HYSTERECTOMY    . APPENDECTOMY    . bilateral tubal ligation    . BLADDER SURGERY     "repair"  . BREAST BIOPSY Right    negative 3 different biopsies  . BREAST SURGERY    . BUNIONECTOMY Bilateral   . CARDIAC CATHETERIZATION    . COLONOSCOPY N/A 12/06/2014   Procedure:  COLONOSCOPY;  Surgeon: Manya Silvas, MD;  Location: [facility] ENDOSCOPY;  Service: Endoscopy;  Laterality: N/A;  . ESOPHAGOGASTRODUODENOSCOPY (EGD) WITH PROPOFOL N/A 02/14/2015   Procedure: ESOPHAGOGASTRODUODENOSCOPY (EGD) WITH PROPOFOL;  Surgeon: Manya Silvas, MD;  Location: [facility] ENDOSCOPY;  Service: Endoscopy;  Laterality: N/A;  . FOOT SURGERY Bilateral   . PELVIC FLOOR REPAIR    . TUBAL LIGATION      FAMILY HISTORY: Family History  Problem Relation Age of Onset  . Hypertension Mother   . Hypothyroidism Mother   . Heart disease Mother   . Lung cancer Mother   . Alcohol abuse Mother   . Depression Mother   . Hypertension Father   . Heart disease Father   . Alcohol abuse Father   . Hypothyroidism Sister   . Hypertension Sister   . Bipolar disorder Sister   . Heart disease Brother   . Alcohol abuse Brother   . Bipolar disorder Brother   . Hypothyroidism Sister   . Hypertension Sister   . Breast cancer Neg Hx     SOCIAL HISTORY: Social History   Social History  . Marital status: Married    Spouse name: Philippa Chester  . Number of children: 3  . Years of education: 22   Occupational History  . unemployed    Social History Main Topics  . Smoking status: Never Smoker  . Smokeless tobacco: Never Used     Comment: tried smoking in youth  . Alcohol use No     Comment: used when she was 66 years old.   . Drug use: Yes    Types: LSD, Marijuana     Comment: 07/21/14 "tried in youth"  . Sexual activity: Yes    Birth control/ protection: None   Other Topics Concern  . Not on file   Social History Narrative   Married, lives at home with husband   Caffeine use- 1 soda, 1 tea daily      PHYSICAL EXAM  Vitals:   09/06/16 0921  BP: 111/65  Pulse: 71  Weight: 166 lb (75.3 kg)  Height: 5\' 2"  (1.575 m)   Body mass index is 30.36 kg/m. , Mallampati 3. Slightly slurred speech today  Generalized: Well developed, in no acute distress -   Neck: Circumference 15-1/2  inches  Neurological examination  Mentation: intact  Cranial nerve :  Taste and smell preserved. Pupils were equal round reactive to light. Facial  strength  normal. Uvula and tongue in midline. Head turning and shoulder shrug  symmetric. Mallampati 3+ Motor: 5/5   DIAGNOSTIC DATA (LABS, IMAGING, TESTING) - I reviewed patient records, labs, notes,  testing and imaging myself where available.  Lab Results  Component Value Date   WBC 5.2 03/07/2016   HGB 11.4 (L) 03/07/2016   HCT 34.3 (L) 03/07/2016   MCV 94.0 03/07/2016   PLT 264 03/07/2016      Component Value Date/Time   NA 138 03/07/2016 1257   K 3.4 (L) 03/07/2016 1257   CL 103 03/07/2016 1257   CO2 25 03/07/2016 1257   GLUCOSE 111 (H) 03/07/2016 1257   BUN 43 (H) 03/07/2016 1257   CREATININE 1.90 (H) 03/07/2016 1257   CALCIUM 9.1 03/07/2016 1257   PROT 6.7 03/07/2016 1257   ALBUMIN 3.8 03/07/2016 1257   AST 23 03/07/2016 1257   ALT 17 03/07/2016 1257   ALKPHOS 79 03/07/2016 1257   BILITOT 0.3 03/07/2016 1257   GFRNONAA 27 (L) 03/07/2016 1257   GFRAA 31 (L) 03/07/2016 1257       ASSESSMENT AND PLAN  66 y.o. year old female  has a past medical history of Adult attention deficit disorder; Anemia; Bipolar disorder ([place]); Cancer [facility]); CHF NYHA class I ([place]); Chronic asthma; Chronic renal impairment, stage 3 (moderate); COPD (chronic obstructive pulmonary disease) ([place]); Coronary artery disease with history of myocardial infarction without history of CABG; Depression; Diabetes mellitus without complication ([place]); Dysrhythmia; Fibromyalgia; Fibromyalgia; GERD without esophagitis; Gout; Gout; Heart murmur; Hepatitis B; Hepatitis C; History of chronic hepatitis; Hyperlipidemia; Hypertension; Hypothyroidism, adult; Narcolepsy due to underlying condition without cataplexy; Obstructive sleep apnea, adult; Osteoarthritis of spine without myelopathy or radiculopathy; Patent foramen ovale; Postural orthostatic tachycardia syndrome;  Raynaud's disease; Raynaud's disease without gangrene; Renal insufficiency; Restless leg syndrome, controlled; Rosacea; Secondary hyperparathyroidism, renal ([place]); Statin intolerance; Surgical menopause on hormone replacement therapy; and Syncope and collapse. here with:  1. Obstructive sleep apnea on CPAP, poorly compliant 2. Sleep deprivation due to poor sleep hygiene.  2. Narcolepsy, confirmed by MSLT - EPWORTH very high- not safe to drive at this point.   The patient is encouraged to use her CPAP nightly for greater than 4 hours each night. She needs to advance her bedtime. She lost weight, which is encouraging.   I will send a prescription to her DME company requesting new supplies, but this may fail based on poor compliance.   The patient is also encouraged to take Adderall XR 30 mg daily to prevent sleepiness.  She doesn't want to take this medication any longer, as her husband feels she is acting different on it.  She can use modafinil. No labs today - patient is followed by nephrologist and PCP.   RV with NP  in 6 months or sooner if needed.       Larey Seat, MD  09/06/2016, 9:57 AM [facility] [address], [place], [place] [address] [phone]

## 2016-09-11 ENCOUNTER — Telehealth: Payer: Self-pay | Admitting: Neurology

## 2016-09-11 NOTE — Telephone Encounter (Signed)
PA placed for Modafinil on cover my meds. KEY: [id number]. May take up to 5 business days

## 2016-09-11 NOTE — Telephone Encounter (Addendum)
PA approved for Modafinil. REFERRAL # [id number]. This is covered from 01/07/2016-01/07/2017.

## 2016-10-10 ENCOUNTER — Other Ambulatory Visit: Payer: Self-pay | Admitting: Internal Medicine

## 2016-10-10 DIAGNOSIS — Z1231 Encounter for screening mammogram for malignant neoplasm of breast: Secondary | ICD-10-CM

## 2016-11-14 ENCOUNTER — Ambulatory Visit
Admission: RE | Admit: 2016-11-14 | Discharge: 2016-11-14 | Disposition: A | Discharge status: Home / Self Care | Payer: Medicare HMO | Source: Ambulatory Visit | Attending: Internal Medicine | Admitting: Internal Medicine

## 2016-11-14 DIAGNOSIS — R928 Other abnormal and inconclusive findings on diagnostic imaging of breast: Secondary | ICD-10-CM | POA: Insufficient documentation

## 2016-11-14 DIAGNOSIS — Z1231 Encounter for screening mammogram for malignant neoplasm of breast: Secondary | ICD-10-CM | POA: Diagnosis not present

## 2016-11-16 ENCOUNTER — Other Ambulatory Visit: Payer: Self-pay | Admitting: Internal Medicine

## 2016-11-16 DIAGNOSIS — N6489 Other specified disorders of breast: Secondary | ICD-10-CM

## 2016-11-16 DIAGNOSIS — R928 Other abnormal and inconclusive findings on diagnostic imaging of breast: Secondary | ICD-10-CM

## 2016-11-22 ENCOUNTER — Other Ambulatory Visit: Payer: Medicare HMO

## 2016-11-22 ENCOUNTER — Ambulatory Visit: Payer: Medicare HMO

## 2017-01-11 DIAGNOSIS — M62838 Other muscle spasm: Secondary | ICD-10-CM | POA: Diagnosis not present

## 2017-01-11 DIAGNOSIS — G259 Extrapyramidal and movement disorder, unspecified: Secondary | ICD-10-CM | POA: Diagnosis not present

## 2017-01-11 DIAGNOSIS — M47812 Spondylosis without myelopathy or radiculopathy, cervical region: Secondary | ICD-10-CM | POA: Diagnosis not present

## 2017-01-15 ENCOUNTER — Other Ambulatory Visit: Payer: Self-pay

## 2017-01-15 ENCOUNTER — Emergency Department
Admission: EM | Admit: 2017-01-15 | Discharge: 2017-01-15 | Disposition: A | Discharge status: Home / Self Care | Payer: Medicare HMO | Attending: Emergency Medicine | Admitting: Emergency Medicine

## 2017-01-15 ENCOUNTER — Emergency Department: Payer: Medicare HMO

## 2017-01-15 DIAGNOSIS — Y998 Other external cause status: Secondary | ICD-10-CM | POA: Insufficient documentation

## 2017-01-15 DIAGNOSIS — Y9210 Unspecified residential institution as the place of occurrence of the external cause: Secondary | ICD-10-CM | POA: Diagnosis not present

## 2017-01-15 DIAGNOSIS — Z79899 Other long term (current) drug therapy: Secondary | ICD-10-CM | POA: Diagnosis not present

## 2017-01-15 DIAGNOSIS — R51 Headache: Secondary | ICD-10-CM | POA: Diagnosis not present

## 2017-01-15 DIAGNOSIS — I252 Old myocardial infarction: Secondary | ICD-10-CM | POA: Insufficient documentation

## 2017-01-15 DIAGNOSIS — W07XXXA Fall from chair, initial encounter: Secondary | ICD-10-CM | POA: Insufficient documentation

## 2017-01-15 DIAGNOSIS — I10 Essential (primary) hypertension: Secondary | ICD-10-CM | POA: Diagnosis not present

## 2017-01-15 DIAGNOSIS — Y9389 Activity, other specified: Secondary | ICD-10-CM | POA: Insufficient documentation

## 2017-01-15 DIAGNOSIS — J449 Chronic obstructive pulmonary disease, unspecified: Secondary | ICD-10-CM | POA: Diagnosis not present

## 2017-01-15 DIAGNOSIS — E1122 Type 2 diabetes mellitus with diabetic chronic kidney disease: Secondary | ICD-10-CM | POA: Insufficient documentation

## 2017-01-15 DIAGNOSIS — Z91040 Latex allergy status: Secondary | ICD-10-CM | POA: Diagnosis not present

## 2017-01-15 DIAGNOSIS — S060X1A Concussion with loss of consciousness of 30 minutes or less, initial encounter: Secondary | ICD-10-CM | POA: Insufficient documentation

## 2017-01-15 DIAGNOSIS — Z85828 Personal history of other malignant neoplasm of skin: Secondary | ICD-10-CM | POA: Insufficient documentation

## 2017-01-15 DIAGNOSIS — I130 Hypertensive heart and chronic kidney disease with heart failure and stage 1 through stage 4 chronic kidney disease, or unspecified chronic kidney disease: Secondary | ICD-10-CM | POA: Diagnosis not present

## 2017-01-15 DIAGNOSIS — I5032 Chronic diastolic (congestive) heart failure: Secondary | ICD-10-CM | POA: Insufficient documentation

## 2017-01-15 DIAGNOSIS — F329 Major depressive disorder, single episode, unspecified: Secondary | ICD-10-CM | POA: Diagnosis not present

## 2017-01-15 DIAGNOSIS — S0990XA Unspecified injury of head, initial encounter: Secondary | ICD-10-CM | POA: Diagnosis not present

## 2017-01-15 DIAGNOSIS — N183 Chronic kidney disease, stage 3 (moderate): Secondary | ICD-10-CM | POA: Diagnosis not present

## 2017-01-15 DIAGNOSIS — F319 Bipolar disorder, unspecified: Secondary | ICD-10-CM | POA: Diagnosis not present

## 2017-01-15 DIAGNOSIS — E039 Hypothyroidism, unspecified: Secondary | ICD-10-CM | POA: Diagnosis not present

## 2017-01-15 DIAGNOSIS — F909 Attention-deficit hyperactivity disorder, unspecified type: Secondary | ICD-10-CM | POA: Insufficient documentation

## 2017-01-15 DIAGNOSIS — S199XXA Unspecified injury of neck, initial encounter: Secondary | ICD-10-CM | POA: Diagnosis not present

## 2017-01-15 DIAGNOSIS — M542 Cervicalgia: Secondary | ICD-10-CM | POA: Diagnosis not present

## 2017-01-15 DIAGNOSIS — R410 Disorientation, unspecified: Secondary | ICD-10-CM | POA: Diagnosis not present

## 2017-01-15 LAB — CBC
HCT: 39.6 % (ref 35.0–47.0)
Hemoglobin: 13.5 g/dL (ref 12.0–16.0)
MCH: 32.6 pg (ref 26.0–34.0)
MCHC: 34.2 g/dL (ref 32.0–36.0)
MCV: 95.3 fL (ref 80.0–100.0)
PLATELETS: 280 10*3/uL (ref 150–440)
RBC: 4.15 MIL/uL (ref 3.80–5.20)
RDW: 15.4 % — AB (ref 11.5–14.5)
WBC: 15 10*3/uL — ABNORMAL HIGH (ref 3.6–11.0)

## 2017-01-15 LAB — URINALYSIS, COMPLETE (UACMP) WITH MICROSCOPIC
Bilirubin Urine: NEGATIVE
GLUCOSE, UA: NEGATIVE mg/dL
Hgb urine dipstick: NEGATIVE
Ketones, ur: NEGATIVE mg/dL
Leukocytes, UA: NEGATIVE
Nitrite: NEGATIVE
PH: 5 (ref 5.0–8.0)
PROTEIN: NEGATIVE mg/dL
SPECIFIC GRAVITY, URINE: 1.013 (ref 1.005–1.030)
WBC, UA: NONE SEEN WBC/hpf (ref 0–5)

## 2017-01-15 LAB — BASIC METABOLIC PANEL
Anion gap: 9 (ref 5–15)
BUN: 50 mg/dL — ABNORMAL HIGH (ref 6–20)
CALCIUM: 9.6 mg/dL (ref 8.9–10.3)
CO2: 28 mmol/L (ref 22–32)
CREATININE: 1.76 mg/dL — AB (ref 0.44–1.00)
Chloride: 103 mmol/L (ref 101–111)
GFR calc Af Amer: 34 mL/min — ABNORMAL LOW (ref >60)
GFR calc non Af Amer: 29 mL/min — ABNORMAL LOW (ref >60)
GLUCOSE: 121 mg/dL — AB (ref 65–99)
Potassium: 4.6 mmol/L (ref 3.5–5.1)
Sodium: 140 mmol/L (ref 135–145)

## 2017-01-15 LAB — GLUCOSE, CAPILLARY: Glucose-Capillary: 92 mg/dL (ref 65–99)

## 2017-01-15 MED ORDER — ONDANSETRON 4 MG PO TBDP
4.0000 mg | ORAL_TABLET | Freq: Once | ORAL | Status: AC
  Administered 2017-01-15: 4 mg via ORAL
  Filled 2017-01-15: qty 1

## 2017-01-15 MED ORDER — MORPHINE SULFATE (PF) 4 MG/ML IV SOLN
4.0000 mg | Freq: Once | INTRAVENOUS | Status: AC
  Administered 2017-01-15: 4 mg via INTRAMUSCULAR
  Filled 2017-01-15: qty 1

## 2017-01-15 MED ORDER — BUTALBITAL-APAP-CAFFEINE 50-325-40 MG PO TABS: 1.0000 | ORAL_TABLET | Freq: Four times a day (QID) | ORAL | 0 refills | Status: DC | PRN

## 2017-01-15 MED ORDER — ONDANSETRON 4 MG PO TBDP: 4.0000 mg | ORAL_TABLET | Freq: Three times a day (TID) | ORAL | 0 refills | Status: DC | PRN

## 2017-01-15 NOTE — ED Notes (Signed)
Pt placed in c-collar

## 2017-01-15 NOTE — ED Triage Notes (Signed)
First nurse note:  Pt from [facility] for fall on Friday.

## 2017-01-15 NOTE — ED Provider Notes (Signed)
[facility] Emergency Department Provider Note  Time seen: 7:17 PM  I have reviewed the triage vital signs and the nursing notes.   HISTORY  Chief Complaint Fall; Headache; and Altered Mental Status    HPI Jamie Murillo is a 67 y.o. female with a past medical history of bipolar, CHF, COPD, diabetes, fibromyalgia, narcolepsy, hypertension, hyperlipidemia, presents to the emergency department for headache vomiting and confusion.  According to the patient 4 days ago she fell backwards out of a chair hitting her head on the ground.  Patient reports loss of consciousness for unknown duration.  She states ever since that incident the patient has had a significant headache with frequent episodes of vomiting, worsening neck pain which she states is chronic but worse now as well as periods of confusion/disorientation.  Currently the patient denies any confusion, oriented x4.  C-collar was placed upon arrival.  Patient is calm, cooperative lying in bed in no apparent distress.   Past Medical History:  Diagnosis Date  . Adult attention deficit disorder   . Anemia   . Bipolar disorder ([place])   . Cancer ([place])    skin  . CHF NYHA class I (HCC)    chronic, diastolic  . Chronic asthma   . Chronic renal impairment, stage 3 (moderate) (HCC)   . COPD (chronic obstructive pulmonary disease) ([place])   . Coronary artery disease with history of myocardial infarction without history of CABG   . Depression   . Diabetes mellitus without complication ([place])   . Dysrhythmia   . Fibromyalgia   . Fibromyalgia   . GERD without esophagitis   . Gout   . Gout   . Heart murmur   . Hepatitis B   . Hepatitis C   . History of chronic hepatitis    Hepatitis B and C  . Hyperlipidemia   . Hypertension   . Hypothyroidism, adult   . 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 Obstructive sleep apnea, adult    CPAP  . Osteoarthritis of spine without myelopathy or  radiculopathy   . Patent foramen ovale   . Postural orthostatic tachycardia syndrome   . Raynaud's disease   . Raynaud's disease without gangrene   . Renal insufficiency    2008 per pt  . Restless leg syndrome, controlled   . Rosacea   . Secondary hyperparathyroidism, renal ([place])   . Statin intolerance   . Surgical menopause on hormone replacement therapy   . Syncope and collapse     Patient Active Problem List   Diagnosis Date Noted  . Excessive daytime sleepiness 09/06/2016  . Excessive sleepiness while driving 40/97/3532  . 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 OSA (obstructive sleep apnea) 11/03/2014  . Attention deficit hyperactivity disorder 11/03/2014  . Major depressive disorder 10/12/2014  . Peripheral arterial occlusive disease ([place]) 10/12/2014  . Hypersomnia with sleep apnea 07/21/2014  . 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 Restless legs syndrome (RLS) 07/21/2014  . OSA on CPAP 07/21/2014  . Chronic diastolic heart failure ([place]) 06/16/2014  . Arteriosclerosis of coronary artery 06/16/2014  . Gastro-esophageal reflux disease without esophagitis 06/16/2014  . H/O hepatic disease 06/16/2014  . Arthralgia of multiple joints 06/16/2014  . 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 Angiopathy, peripheral ([place]) 06/16/2014  . FO (foramen ovale) 06/16/2014  . Restless leg 06/16/2014  . Paroxysmal digital cyanosis 06/16/2014  . Hyperparathyroidism due to renal insufficiency ([place]) 06/16/2014  . Drug intolerance 06/16/2014  . Failed,  ovarian, postablative 06/16/2014  . DDD (degenerative disc disease), lumbar 06/03/2014  . Chronic kidney disease (CKD), stage III (moderate) ([place]) 04/12/2014  . Degenerative arthritis of spine 04/12/2014  . Acne erythematosa 04/12/2014  . Other specified health status 04/12/2014  . Adaptive colitis 07/17/2013  . ADD (attention deficit disorder) 10/07/2012  . 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 Fibrositis 05/07/2012   . Pelvic muscle wasting 05/07/2012  . Hemorrhoid 01/18/2012  . Absolute anemia 11/27/2011  . Gout 11/27/2011  . HLD (hyperlipidemia) 11/27/2011  . Adult hypothyroidism 11/27/2011  . Chronic obstructive pulmonary disease ([place]) 11/27/2011  . Female genuine stress incontinence 06/15/2011  . Enterocele 06/15/2011  . Acquired hammer toe 05/24/2011  . Bunion 10/31/2010  . Aortic heart valve narrowing 10/02/2010  . Airway hyperreactivity 10/02/2010  . 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 BP (high blood pressure) 10/02/2010  . Obstructive apnea 10/02/2010  . Episode of syncope 10/02/2010  . 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 Myocardial infarction ([place]) 10/02/2010  . Type 2 diabetes mellitus ([place]) 10/02/2010  . ASD (atrial septal defect), ostium secundum 06/29/2006  . Diabetes ([place]) 06/29/2006    Past Surgical History:  Procedure Laterality Date  . ABDOMINAL HYSTERECTOMY    . APPENDECTOMY    . bilateral tubal ligation    . BLADDER SURGERY     "repair"  . BREAST BIOPSY Right    negative 3 different biopsies  . BREAST SURGERY    . BUNIONECTOMY Bilateral   . CARDIAC CATHETERIZATION    . COLONOSCOPY N/A 12/06/2014   Procedure: COLONOSCOPY;  Surgeon: Manya Silvas, MD;  Location: [facility] ENDOSCOPY;  Service: Endoscopy;  Laterality: N/A;  . ESOPHAGOGASTRODUODENOSCOPY (EGD) WITH PROPOFOL N/A 02/14/2015   Procedure: ESOPHAGOGASTRODUODENOSCOPY (EGD) WITH PROPOFOL;  Surgeon: Manya Silvas, MD;  Location: [facility] ENDOSCOPY;  Service: Endoscopy;  Laterality: N/A;  . FOOT SURGERY Bilateral   . PELVIC FLOOR REPAIR    . TUBAL LIGATION      Prior to Admission medications   Medication Sig Start Date End Date Taking? Authorizing Provider  albuterol (PROAIR HFA) 108 (90 BASE) MCG/ACT inhaler Inhale 1-2 puffs into the lungs every 4 (four) hours as needed for wheezing or shortness of breath. 08/12/14   Bobetta Lime, MD  albuterol (PROVENTIL) (2.5 MG/3ML) 0.083% nebulizer solution  Inhale into the lungs. Reported on 02/03/2015    [provider]  allopurinol (ZYLOPRIM) 100 MG tablet Take 200 mg by mouth.     [provider]  ammonium lactate (LAC-HYDRIN) 12 % lotion APPLY TO DAMP SKIN DAILY 09/09/14   [provider]  Clobetasol Propionate 0.05 % shampoo  09/10/14   [provider]  estradiol (ESTRACE) 0.5 MG tablet Take 0.5 mg by mouth daily.  06/13/14   [provider]  fluticasone Asencion Islam) 50 MCG/ACT nasal spray instill 2 sprays into each nostril NIGHTLY 09/08/14   [provider]  furosemide (LASIX) 40 MG tablet Take 40 mg by mouth daily. 09/15/14   [provider]  gabapentin (NEURONTIN) 300 MG capsule Take 600 mg by mouth at bedtime. 06/21/14   [provider]  levothyroxine (SYNTHROID, LEVOTHROID) 25 MCG tablet Take by mouth.    [provider]  losartan (COZAAR) 50 MG tablet Take by mouth. 03/18/14   [provider]  metoprolol succinate (TOPROL-XL) 50 MG 24 hr tablet Take 1 tablet (50 mg total) by mouth daily. 07/23/14   Bobetta Lime, MD  modafinil (PROVIGIL) 200 MG tablet Take 1 tablet (200 mg total) by  mouth daily. 09/06/16   Dohmeier, Asencion Partridge, MD  montelukast (SINGULAIR) 10 MG tablet  09/12/14   [provider]  pantoprazole (PROTONIX) 40 MG tablet Take 40 mg by mouth daily. 01/07/15   [provider]  pramipexole (MIRAPEX) 1 MG tablet  09/12/14   [provider]  SYMBICORT 160-4.5 MCG/ACT inhaler inhale 2 puffs by mouth twice a day 12/08/14   Bobetta Lime, MD  tretinoin (RETIN-A) 0.05 % cream Apply topically at bedtime.  01/19/14   [provider]    Allergies  Allergen Reactions  . Bee Venom Anaphylaxis  . Desvenlafaxine Rash, Shortness Of Breath and Anaphylaxis  . Diltiazem Hcl Anaphylaxis  . Neomycin Anaphylaxis  . Other Anaphylaxis    pistacchios  . Pneumococcal Polysaccharide Vaccine Dermatitis, Rash and Swelling  . Prednisone  Anaphylaxis  . Tetracycline Anaphylaxis  . Latex Rash  . Ampicillin Hives    Then anaphylaxsis  . Erythromycin Hives    Then anaphylaxsis  . Influenza Vaccines     Patient has a history of anaphylaxis to neomycin and other agents of that class.    . Lamotrigine Hives  . Penicillins Nausea And Vomiting  . Pneumococcal Vaccine Hives  . Septra [Sulfamethoxazole-Trimethoprim]     Leads to anaphylaxsis  . Buspirone Rash    Leads to anaphylaxsis  . Cortisone Rash  . Imipramine Rash  . Statins Rash    Statin induced myopathy  . Tape Rash    BUSPAR. BUSPAR SEPTRA. SEPTRA  . Ziprasidone Hcl Rash    Family History  Problem Relation Age of Onset  . Hypertension Mother   . Hypothyroidism Mother   . Heart disease Mother   . Lung cancer Mother   . Alcohol abuse Mother   . Depression Mother   . Hypertension Father   . Heart disease Father   . Alcohol abuse Father   . Hypothyroidism Sister   . Hypertension Sister   . Bipolar disorder Sister   . Breast cancer Sister 65  . Heart disease Brother   . Alcohol abuse Brother   . Bipolar disorder Brother   . Hypothyroidism Sister   . Hypertension Sister     Social History Social History   Tobacco Use  . Smoking status: Never Smoker  . Smokeless tobacco: Never Used  . Tobacco comment: tried smoking in youth  Substance Use Topics  . Alcohol use: No    Alcohol/week: 0.0 oz    Comment: used when she was 67 years old.   . Drug use: Yes    Types: LSD, Marijuana    Comment: 07/21/14 "tried in youth"    Review of Systems Constitutional: Positive for LOC 4 days ago. Eyes: Negative for visual changes. ENT: Negative for congestion Cardiovascular: Negative for chest pain. Respiratory: Negative for shortness of breath. Gastrointestinal: Negative for abdominal pain Genitourinary: Negative for dysuria. Musculoskeletal: Positive for neck pain, chronic but worse than normal per patient. Skin: Negative for rash. Neurological: Positive  for headache.  Denies any focal weakness or numbness. All other ROS negative  ____________________________________________   PHYSICAL EXAM:  VITAL SIGNS: ED Triage Vitals  Enc Vitals Group     BP 01/15/17 1800 124/60     Pulse Rate 01/15/17 1800 80     Resp --      Temp 01/15/17 1800 97.8 F (36.6 C)     Temp Source 01/15/17 1800 Oral     SpO2 01/15/17 1800 100 %     Weight 01/15/17 1800 165 lb (  74.8 kg)     Height 01/15/17 1800 5\' 3"  (1.6 m)     Head Circumference --      Peak Flow --      Pain Score 01/15/17 1805 8     Pain Loc --      Pain Edu? --      Excl. in [place]? --    Constitutional: Alert and oriented. Well appearing and in no distress.  C-collar in place. Eyes: Normal exam ENT   Head: Normocephalic and atraumatic.  Moderate C-spine tenderness to palpation.  C-collar in place.   Nose: No congestion/rhinnorhea.   Mouth/Throat: Mucous membranes are moist. Cardiovascular: Normal rate, regular rhythm. No murmur Respiratory: Normal respiratory effort without tachypnea nor retractions. Breath sounds are clear Gastrointestinal: Soft and nontender. No distention.   Musculoskeletal: Nontender with normal range of motion in all extremities.  Neurologic:  Normal speech and language. No gross focal neurologic deficits Skin:  Skin is warm, dry and intact.  Psychiatric: Mood and affect are normal.  ____________________________________________    EKG  EKG reviewed and interpreted by myself shows normal sinus rhythm 80 bpm with a narrow QRS, left axis deviation, normal intervals no concerning ST changes.  ____________________________________________    RADIOLOGY  CT head and neck are normal.  ____________________________________________   INITIAL IMPRESSION / ASSESSMENT AND PLAN / ED COURSE  Pertinent labs & imaging results that were available during my care of the patient were reviewed by me and considered in my medical decision making (see chart for  details).  Patient presents to the emergency department after a fall with a head injury 4 days ago now with continued headache occasional episodes of nausea vomiting and confusion at times per husband.  Currently the patient appears well, no distress, normal exam besides C-spine tenderness.  We will dose with pain and nausea medication while awaiting labs and CT results.  Patient's labs have resulted largely within normal limits besides a moderate leukocytosis.  Renal insufficiency is at baseline.  Patient CT scans of the head and neck are negative for acute abnormality.  C-spine has been cleared and c-collar removed.   Overall the patient appears very well, highly suspect postconcussive syndrome.  We will discharge with a short course of pain and nausea medication and have the patient follow-up with her doctor.  Patient agreeable to plan. ____________________________________________   FINAL CLINICAL IMPRESSION(S) / ED DIAGNOSES  Concussion    Harvest Dark, MD 01/15/17 [place]

## 2017-01-15 NOTE — ED Triage Notes (Addendum)
Pt to ER via POV from [facility] c/o headache, confusion, emesis and fatigue since Friday. Pt had episode of falling backwards in chair and hit floor, LOC with fall on Friday. Family states that she has been disoriented since. Oriented X 4 at this time. C/o lower neck pain.

## 2017-02-27 ENCOUNTER — Ambulatory Visit: Payer: Medicare HMO | Admitting: Adult Health

## 2017-02-27 ENCOUNTER — Encounter: Payer: Self-pay | Admitting: Adult Health

## 2017-02-27 VITALS — BP 107/63 | HR 69 | Ht 63.0 in | Wt 177.2 lb

## 2017-02-27 DIAGNOSIS — G4733 Obstructive sleep apnea (adult) (pediatric): Secondary | ICD-10-CM | POA: Diagnosis not present

## 2017-02-27 DIAGNOSIS — Z9989 Dependence on other enabling machines and devices: Secondary | ICD-10-CM

## 2017-02-27 DIAGNOSIS — G47419 Narcolepsy without cataplexy: Secondary | ICD-10-CM

## 2017-02-27 MED ORDER — AMPHETAMINE-DEXTROAMPHET ER 20 MG PO CP24: 20.0000 mg | ORAL_CAPSULE | Freq: Every day | ORAL | 0 refills | Status: DC

## 2017-02-27 NOTE — Patient Instructions (Signed)
Your Plan:  Continue using CPAP- try using nightly >4 hours  Mask refitting Restart adderall XR 20 mg No driving until daytime sleepiness is under control  Thank you for coming to see [place] at [facility]. I hope we have been able to provide you high quality care today.  You may receive a patient satisfaction survey over the next few weeks. We would appreciate your feedback and comments so that we may continue to improve ourselves and the health of our patients.  Amphetamine; Dextroamphetamine tablets What is this medicine? AMPHETAMINE; DEXTROAMPHETAMINE(am FET a meen; dex troe am FET a meen) is used to treat attention-deficit hyperactivity disorder (ADHD). It may also be used for narcolepsy. Federal law prohibits giving this medicine to any person other than the person for whom it was prescribed. Do not share this medicine with anyone else. This medicine may be used for other purposes; ask your health care provider or pharmacist if you have questions. COMMON BRAND NAME(S): Adderall What should I tell my health care provider before I take this medicine? They need to know if you have any of these conditions: -anxiety or panic attacks -circulation problems in fingers and toes -glaucoma -hardening or blockages of the arteries or heart blood vessels -heart disease or a heart defect -high blood pressure -history of a drug or alcohol abuse problem -history of stroke -kidney disease -liver disease -mental illness -seizures -suicidal thoughts, plans, or attempt; a previous suicide attempt by you or a family member -thyroid disease -Tourette's syndrome -an unusual or allergic reaction to dextroamphetamine, other amphetamines, other medicines, foods, dyes, or preservatives -pregnant or trying to get pregnant -breast-feeding How should I use this medicine? Take this medicine by mouth with a glass of water. Follow the directions on the prescription label. Take your doses at  regular intervals. Do not take your medicine more often than directed. Do not suddenly stop your medicine. You must gradually reduce the dose or you may feel withdrawal effects. Ask your doctor or health care professional for advice. Talk to your pediatrician regarding the use of this medicine in children. Special care may be needed. While this drug may be prescribed for children as young as 3 years for selected conditions, precautions do apply. Overdosage: If you think you have taken too much of this medicine contact a poison control center or emergency room at once. NOTE: This medicine is only for you. Do not share this medicine with others. What if I miss a dose? If you miss a dose, take it as soon as you can. If it is almost time for your next dose, take only that dose. Do not take double or extra doses. What may interact with this medicine? Do not take this medicine with any of the following medications: -MAOIS like Carbex, Eldepryl, Marplan, Nardil, and Parnate -other stimulant medicines for attention disorders, weight loss, or to stay awake This medicine may also interact with the following medications: -acetazolamide -ammonium chloride -antacids -ascorbic acid -atomoxetine -caffeine -certain medicines for blood pressure -certain medicines for depression, anxiety, or psychotic disturbances -certain medicines for seizures like carbamazepine, phenobarbital, phenytoin -certain medicines for stomach problems like cimetidine, famotidine, omeprazole, lansoprazole -cold or allergy medicines -glutamic acid -lithium -meperidine -methenamine; sodium acid phosphate -narcotic medicines for pain -norepinephrine -phenothiazines like chlorpromazine, mesoridazine, prochlorperazine, thioridazine -sodium acid phosphate -sodium bicarbonate This list may not describe all possible interactions. Give your health care provider a list of all the medicines, herbs, non-prescription drugs, or dietary  supplements you use. Also  tell them if you smoke, drink alcohol, or use illegal drugs. Some items may interact with your medicine. What should I watch for while using this medicine? Visit your doctor or health care professional for regular checks on your progress. This prescription requires that you follow special procedures with your doctor and pharmacy. You will need to have a new written prescription from your doctor every time you need a refill. This medicine may affect your concentration, or hide signs of tiredness. Until you know how this medicine affects you, do not drive, ride a bicycle, use machinery, or do anything that needs mental alertness. Tell your doctor or health care professional if this medicine loses its effects, or if you feel you need to take more than the prescribed amount. Do not change the dosage without talking to your doctor or health care professional. Decreased appetite is a common side effect when starting this medicine. Eating small, frequent meals or snacks can help. Talk to your doctor if you continue to have poor eating habits. Height and weight growth of a child taking this medicine will be monitored closely. Do not take this medicine close to bedtime. It may prevent you from sleeping. If you are going to need surgery, a MRI, CT scan, or other procedure, tell your doctor that you are taking this medicine. You may need to stop taking this medicine before the procedure. Tell your doctor or healthcare professional right away if you notice unexplained wounds on your fingers and toes while taking this medicine. You should also tell your healthcare provider if you experience numbness or pain, changes in the skin color, or sensitivity to temperature in your fingers or toes. What side effects may I notice from receiving this medicine? Side effects that you should report to your doctor or health care professional as soon as possible: -allergic reactions like skin rash, itching or  hives, swelling of the face, lips, or tongue -changes in vision -chest pain or chest tightness -confusion, trouble speaking or understanding -fast, irregular heartbeat -fingers or toes feel numb, cool, painful -hallucination, loss of contact with reality -high blood pressure -males: prolonged or painful erection -seizures -severe headaches -shortness of breath -suicidal thoughts or other mood changes -trouble walking, dizziness, loss of balance or coordination -uncontrollable head, mouth, neck, arm, or leg movements Side effects that usually do not require medical attention (report to your doctor or health care professional if they continue or are bothersome): -anxious -headache -loss of appetite -nausea, vomiting -trouble sleeping -weight loss This list may not describe all possible side effects. Call your doctor for medical advice about side effects. You may report side effects to FDA at 1-800-FDA-1088. Where should I keep my medicine? Keep out of the reach of children. This medicine can be abused. Keep your medicine in a safe place to protect it from theft. Do not share this medicine with anyone. Selling or giving away this medicine is dangerous and against the law. Store at room temperature between 15 and 30 degrees C (59 and 86 degrees F). Keep container tightly closed. Throw away any unused medicine after the expiration date. Dispose of properly. This medicine may cause accidental overdose and death if it is taken by other adults, children, or pets. Mix any unused medicine with a substance like cat litter or coffee grounds. Then throw the medicine away in a sealed container like a sealed bag or a coffee can with a lid. Do not use the medicine after the expiration date. NOTE: This sheet is  a summary. It may not cover all possible information. If you have questions about this medicine, talk to your doctor, pharmacist, or health care provider.  2018 Elsevier/Gold Standard (2013-10-28  18:44:41)

## 2017-02-27 NOTE — Progress Notes (Signed)
I agree with the assessment and plan as directed by NP .The patient is known to me .  CPAP with high airleak- can be ignored if AHI is controlled and patient is not bothered by leak.    Aariana Shankland, MD

## 2017-02-27 NOTE — Progress Notes (Signed)
PATIENT: Jamie Murillo DOB: [birth year]  REASON FOR VISIT: follow up HISTORY FROM: patient  HISTORY OF PRESENT ILLNESS: Today 02/27/17 Jamie Murillo is a 67 year old female with a history of obstructive sleep apnea on CPAP and narcolepsy.  She returns today for follow-up.  At the last visit she was switched from Adderall to Provigil but reports that she cannot tolerate Provigil and did not find it beneficial.  She states that she is struggling with daytime sleepiness.  She reports that she was recently at work sitting and a chair and fell asleep and fell backwards causing her to have a concussion.  She did go to the emergency room.  The patient CPAP download indicates that she use her machine 30 out of 30 days for compliance of 100%.  She use her machine greater than 4 hours only 7 out of 30 days for compliance of 23%.  On average she uses her machine 3 hours and 7 minutes.  She is on AutoSet with a minimum pressure of 5 degrees of water and maximum pressure of 15 cm of water with EPR of 2.  Her residual AHI is 3.7.  She has a leak in the 95th percentile at 66.5 L/min.  She returns today for evaluation.  HISTORY Interval history from 09/06/2016. Jamie Murillo reports that her husband is working a late shift and she now goes to bed after midnight sometimes at one or 2 AM but she rises in the morning for early mass about 5:30. This is reflected in her CPAP use. She is used to machine 28 out of 30 days with a compliance of 93%, but she only managed 6 days of 4 hours or more of use. She is continuing to use an AutoSet between 5 and 15 cm water with 2 cm EPR, she does have significant air leaks but her residual AHI is 3.4 and sufficient. I am sorry that she just doesn't get enough sleep to repeat the benefits of apnea therapy. She states that her husband comes home late and she would wake up and he enters the bedroom. I'm not sure what to offer her, other than establishing a sleep routine with a bedtime  around 10 PM to locate at least 7 hours of sleep. Her fatigue is very high at 52 points, her Epworth sleepiness score is excessive at 23 out of 24 points, I would question safety when driving or operating machinery with this degree of sleepiness. The geriatric depression score was endorsed at only 3 out of 15 points.  REVIEW OF SYSTEMS: Out of a complete 14 system review of symptoms, the patient complains only of the following symptoms, and all other reviewed systems are negative.  ALLERGIES: Allergies  Allergen Reactions  . Bee Venom Anaphylaxis  . Desvenlafaxine Rash, Shortness Of Breath and Anaphylaxis  . Diltiazem Hcl Anaphylaxis  . Neomycin Anaphylaxis  . Other Anaphylaxis    pistacchios  . Pneumococcal Polysaccharide Vaccine Dermatitis, Rash and Swelling  . Prednisone Anaphylaxis  . Tetracycline Anaphylaxis  . Latex Rash  . Ampicillin Hives    Then anaphylaxsis  . Erythromycin Hives    Then anaphylaxsis  . Influenza Vaccines     Patient has a history of anaphylaxis to neomycin and other agents of that class.    . Lamotrigine Hives  . Penicillins Nausea And Vomiting  . Pneumococcal Vaccine Hives  . Septra [Sulfamethoxazole-Trimethoprim]     Leads to anaphylaxsis  . Buspirone Rash    Leads to anaphylaxsis  .  Cortisone Rash  . Imipramine Rash  . Statins Rash    Statin induced myopathy  . Tape Rash    BUSPAR. BUSPAR SEPTRA. SEPTRA  . Ziprasidone Hcl Rash    HOME MEDICATIONS: Outpatient Medications Prior to Visit  Medication Sig Dispense Refill  . albuterol (PROAIR HFA) 108 (90 BASE) MCG/ACT inhaler Inhale 1-2 puffs into the lungs every 4 (four) hours as needed for wheezing or shortness of breath. 1 Inhaler 3  . albuterol (PROVENTIL) (2.5 MG/3ML) 0.083% nebulizer solution Inhale into the lungs. Reported on 02/03/2015    . allopurinol (ZYLOPRIM) 100 MG tablet Take 100 mg by mouth.     . estradiol (ESTRACE) 0.5 MG tablet Take 0.5 mg by mouth daily.   0  . furosemide  (LASIX) 40 MG tablet Take 40 mg by mouth daily.  0  . gabapentin (NEURONTIN) 300 MG capsule Take 300 mg by mouth at bedtime.   0  . levothyroxine (SYNTHROID, LEVOTHROID) 25 MCG tablet Take by mouth.    . losartan (COZAAR) 50 MG tablet Take by mouth.    . metoprolol succinate (TOPROL-XL) 50 MG 24 hr tablet Take 1 tablet (50 mg total) by mouth daily. 90 tablet 3  . montelukast (SINGULAIR) 10 MG tablet   0  . pantoprazole (PROTONIX) 40 MG tablet Take 40 mg by mouth daily.  0  . pramipexole (MIRAPEX) 1 MG tablet   0  . SYMBICORT 160-4.5 MCG/ACT inhaler inhale 2 puffs by mouth twice a day 10.2 Inhaler 2  . modafinil (PROVIGIL) 200 MG tablet Take 1 tablet (200 mg total) by mouth daily. (Patient not taking: Reported on 02/27/2017) 30 tablet 5  . ammonium lactate (LAC-HYDRIN) 12 % lotion APPLY TO DAMP SKIN DAILY  1  . butalbital-acetaminophen-caffeine (FIORICET, ESGIC) 50-325-40 MG tablet Take 1-2 tablets by mouth every 6 (six) hours as needed for headache. (Patient not taking: Reported on 02/27/2017) 20 tablet 0  . Clobetasol Propionate 0.05 % shampoo   0  . fluticasone (FLONASE) 50 MCG/ACT nasal spray instill 2 sprays into each nostril NIGHTLY  0  . ondansetron (ZOFRAN ODT) 4 MG disintegrating tablet Take 1 tablet (4 mg total) by mouth every 8 (eight) hours as needed for nausea or vomiting. (Patient not taking: Reported on 02/27/2017) 20 tablet 0  . tretinoin (RETIN-A) 0.05 % cream Apply topically at bedtime.      No facility-administered medications prior to visit.     PAST MEDICAL HISTORY: Past Medical History:  Diagnosis Date  . Adult attention deficit disorder   . Anemia   . Bipolar disorder ([place])   . Cancer ([place])    skin  . CHF NYHA class I (HCC)    chronic, diastolic  . Chronic asthma   . Chronic renal impairment, stage 3 (moderate) (HCC)   . COPD (chronic obstructive pulmonary disease) ([place])   . Coronary artery disease with history of myocardial infarction without history of CABG   .  Depression   . Diabetes mellitus without complication ([place])   . Dysrhythmia   . Fibromyalgia   . Fibromyalgia   . GERD without esophagitis   . Gout   . Gout   . Heart murmur   . Hepatitis B   . Hepatitis C   . History of chronic hepatitis    Hepatitis B and C  . Hyperlipidemia   . Hypertension   . Hypothyroidism, adult   . 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 Obstructive sleep apnea, adult  CPAP  . 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 Patent foramen ovale   . Postural orthostatic tachycardia syndrome   . Raynaud's disease   . Raynaud's disease without gangrene   . Renal insufficiency    2008 per pt  . Restless leg syndrome, controlled   . Rosacea   . Secondary hyperparathyroidism, renal ([place])   . Statin intolerance   . Surgical menopause on hormone replacement therapy   . Syncope and collapse     PAST SURGICAL HISTORY: Past Surgical History:  Procedure Laterality Date  . ABDOMINAL HYSTERECTOMY    . APPENDECTOMY    . bilateral tubal ligation    . BLADDER SURGERY     "repair"  . BREAST BIOPSY Right    negative 3 different biopsies  . BREAST SURGERY    . BUNIONECTOMY Bilateral   . CARDIAC CATHETERIZATION    . COLONOSCOPY N/A 12/06/2014   Procedure: COLONOSCOPY;  Surgeon: Manya Silvas, MD;  Location: [facility] ENDOSCOPY;  Service: Endoscopy;  Laterality: N/A;  . ESOPHAGOGASTRODUODENOSCOPY (EGD) WITH PROPOFOL N/A 02/14/2015   Procedure: ESOPHAGOGASTRODUODENOSCOPY (EGD) WITH PROPOFOL;  Surgeon: Manya Silvas, MD;  Location: [facility] ENDOSCOPY;  Service: Endoscopy;  Laterality: N/A;  . FOOT SURGERY Bilateral   . PELVIC FLOOR REPAIR    . TUBAL LIGATION      FAMILY HISTORY: Family History  Problem Relation Age of Onset  . Hypertension Mother   . Hypothyroidism Mother   . Heart disease Mother   . Lung cancer Mother   . Alcohol abuse Mother   . Depression Mother   . Hypertension Father   . Heart disease Father     . Alcohol abuse Father   . Hypothyroidism Sister   . Hypertension Sister   . Bipolar disorder Sister   . Breast cancer Sister 63  . Heart disease Brother   . Alcohol abuse Brother   . Bipolar disorder Brother   . Hypothyroidism Sister   . Hypertension Sister   . Prostate cancer Brother   . Other Brother   . Prostate cancer Brother   . Breast cancer Sister     SOCIAL HISTORY: Social History   Socioeconomic History  . Marital status: Married    Spouse name: Philippa Chester  . Number of children: 3  . Years of education: 45  . Highest education level: Not on file  Social Needs  . Financial resource strain: Not on file  . Food insecurity - worry: Not on file  . Food insecurity - inability: Not on file  . Transportation needs - medical: Not on file  . Transportation needs - non-medical: Not on file  Occupational History  . Occupation: unemployed  Tobacco Use  . Smoking status: Never Smoker  . Smokeless tobacco: Never Used  . Tobacco comment: tried smoking in youth  Substance and Sexual Activity  . Alcohol use: No    Alcohol/week: 0.0 oz    Comment: used when she was 67 years old.   . Drug use: Yes    Types: LSD, Marijuana    Comment: 07/21/14 "tried in youth"  . Sexual activity: Yes    Birth control/protection: None  Other Topics Concern  . Not on file  Social History Narrative   Married, lives at home with husband   Caffeine use- 1 soda, 1 tea daily      PHYSICAL EXAM  Vitals:   02/27/17 0848  BP: 107/63  Pulse: 69  Weight: 177 lb 3.2 oz (80.4 kg)  Height: 5\' 3"  (1.6 m)   Body mass index is 31.39 kg/m.  Generalized: Well developed, in no acute distress   Neurological examination  Mentation: Alert oriented to time, place, history taking. Follows all commands speech and language fluent Cranial nerve II-XII: Pupils were equal round reactive to light. Extraocular movements were full, visual field were full on confrontational test. Facial sensation and strength  were normal. Uvula tongue midline. Head turning and shoulder shrug  were normal and symmetric. Motor: The motor testing reveals 5 over 5 strength of all 4 extremities. Good symmetric motor tone is noted throughout.  Sensory: Sensory testing is intact to soft touch on all 4 extremities. No evidence of extinction is noted.  Coordination: Cerebellar testing reveals good finger-nose-finger and heel-to-shin bilaterally.  Gait and station: Gait is normal. Tandem gait is normal. Romberg is negative. No drift is seen. Reflexes: Deep tendon reflexes are symmetric and normal bilaterally.   DIAGNOSTIC DATA (LABS, IMAGING, TESTING) - I reviewed patient records, labs, notes, testing and imaging myself where available.  Lab Results  Component Value Date   WBC 15.0 (H) 01/15/2017   HGB 13.5 01/15/2017   HCT 39.6 01/15/2017   MCV 95.3 01/15/2017   PLT 280 01/15/2017      Component Value Date/Time   NA 140 01/15/2017 1800   K 4.6 01/15/2017 1800   CL 103 01/15/2017 1800   CO2 28 01/15/2017 1800   GLUCOSE 121 (H) 01/15/2017 1800   BUN 50 (H) 01/15/2017 1800   CREATININE 1.76 (H) 01/15/2017 1800   CALCIUM 9.6 01/15/2017 1800   PROT 6.7 03/07/2016 1257   ALBUMIN 3.8 03/07/2016 1257   AST 23 03/07/2016 1257   ALT 17 03/07/2016 1257   ALKPHOS 79 03/07/2016 1257   BILITOT 0.3 03/07/2016 1257   GFRNONAA 29 (L) 01/15/2017 1800   GFRAA 34 (L) 01/15/2017 1800       ASSESSMENT AND PLAN 67 y.o. year old female  has a past medical history of Adult attention deficit disorder, Anemia, Bipolar disorder ([place]), Cancer ([place]), CHF NYHA class I ([place]), Chronic asthma, Chronic renal impairment, stage 3 (moderate) ([place]), COPD (chronic obstructive pulmonary disease) ([place]), Coronary artery disease with history of myocardial infarction without history of CABG, Depression, Diabetes mellitus without complication ([place]), Dysrhythmia, Fibromyalgia, Fibromyalgia, GERD without esophagitis, Gout, Gout, Heart murmur, Hepatitis  B, Hepatitis C, History of chronic hepatitis, Hyperlipidemia, Hypertension, Hypothyroidism, adult, Narcolepsy due to underlying condition without cataplexy, Obstructive sleep apnea, adult, Osteoarthritis of spine without myelopathy or radiculopathy, Patent foramen ovale, Postural orthostatic tachycardia syndrome, Raynaud's disease, Raynaud's disease without gangrene, Renal insufficiency, Restless leg syndrome, controlled, Rosacea, Secondary hyperparathyroidism, renal ([place]), Statin intolerance, Surgical menopause on hormone replacement therapy, and Syncope and collapse. here with :  1. OSA on CPAP 2. Narcolepsy  Patient CPAP download shows suboptimal compliance.  The patient uses the machine nightly but does not use the machine greater than 4 hours every night.  She also has a significant leak with her mask.  I will get the patient set up for mask refitting.  But I explained that compliance with CPAP will also help her daytime sleepiness.  The patient cannot tolerate Provigil but does want to retry Adderall extended release.  I will give her prescription for Adderall extended release 20 mg daily.  If she cannot tolerate this she will let us know.  Advised patient that she should not operate a motor vehicle until we have her daytime sleepiness under good control.  She voiced understanding.  She will follow-up in 3 months or sooner if needed.    Ward Givens, MSN, NP-C 02/27/2017, 9:08 AM [facility] [address] [place], [place] [address] [phone]

## 2017-02-28 NOTE — Telephone Encounter (Signed)
[place], spoke with Romie Minus. Gave required information. Pt has been stable on Adderall XR since 08-09-14; she has tried /failed Provigil- ineffective and side effects; cannot take Xyrem- kidney and heart disease. Romie Minus stated the medications on formulary are dextroamphetamine, methylphenidate and methylphenidate ER, dexmethylphenidate. This PA  Ref [id number]. Decision will be faxed in 24-72 hrs

## 2017-02-28 NOTE — Progress Notes (Signed)
DME order for mask refitting successfully faxed to [facility].

## 2017-03-01 ENCOUNTER — Telehealth: Payer: Self-pay | Admitting: *Deleted

## 2017-03-01 NOTE — Telephone Encounter (Signed)
02/28/17 Call to [employer]: [facility] [employer], spoke with Romie Minus. Gave required information. Pt has been stable on Adderall XR since 08-09-14; she has tried /failed Provigil- ineffective and side effects; cannot take Xyrem- kidney and heart disease. Romie Minus stated the medications on formulary are dextroamphetamine, methylphenidate and methylphenidate ER, dexmethylphenidate. This PA  Ref [id number]. Decision will be faxed in 24-72 hrs   Received decision today from [employer]: Dextroamp-amphet ER 20 mg caps approved (generic for Adderall XR), authorization good until 01/07/18. Will notify patient through my chart.

## 2017-03-07 ENCOUNTER — Ambulatory Visit: Payer: Medicare HMO | Admitting: Adult Health

## 2017-03-23 ENCOUNTER — Encounter: Payer: Self-pay | Admitting: Adult Health

## 2017-03-27 MED ORDER — AMPHETAMINE-DEXTROAMPHETAMINE 10 MG PO TABS: 10.0000 mg | ORAL_TABLET | Freq: Every day | ORAL | 0 refills | Status: DC

## 2017-04-24 IMAGING — CT CT HEAD W/O CM
1 series · 16 of 30 positions shown, 20 images · non-contrast
Comparison: Prior CT scan of the neck 01/20/2015; prior CT scan of
the head 12/30/2006

CLINICAL DATA: 64-year-old female with head and neck pain for the
past 3 days

EXAM:
CT HEAD WITHOUT CONTRAST
TECHNIQUE: Contiguous axial images were obtained from the base of the skull
through the vertex without intravenous contrast.

[Series 2: head wo · axial · 0.39mm/px · z∈[-134,-3]mm · 16 of 30 slices shown, 20 images]
[im 2/30  brain]
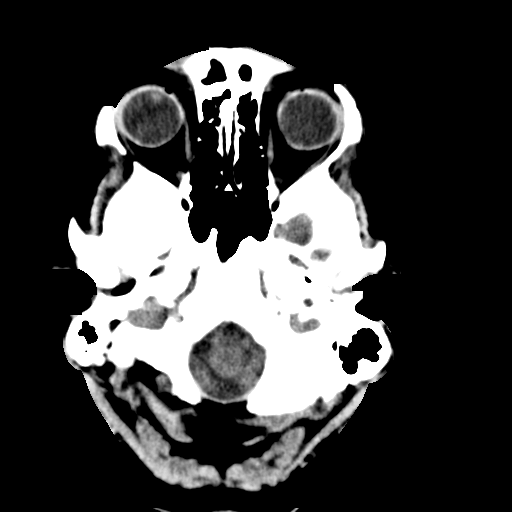
[im 2/30  bone]
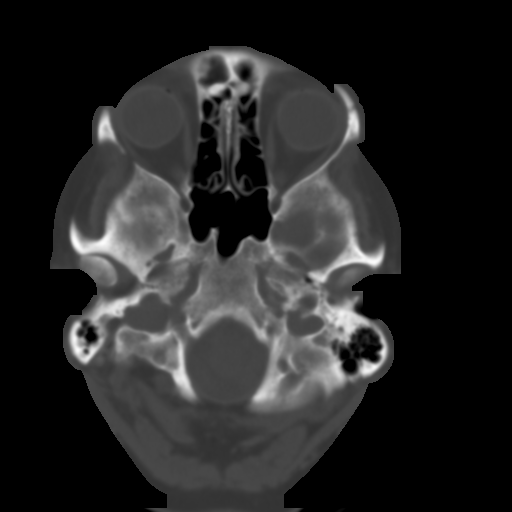
[im 4/30  brain]
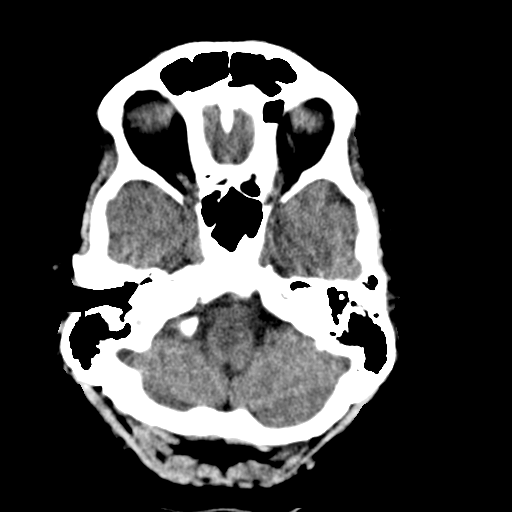
[im 6/30  brain]
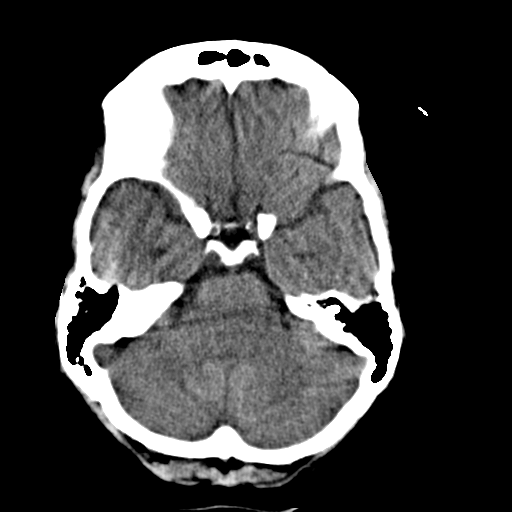
[im 8/30  brain]
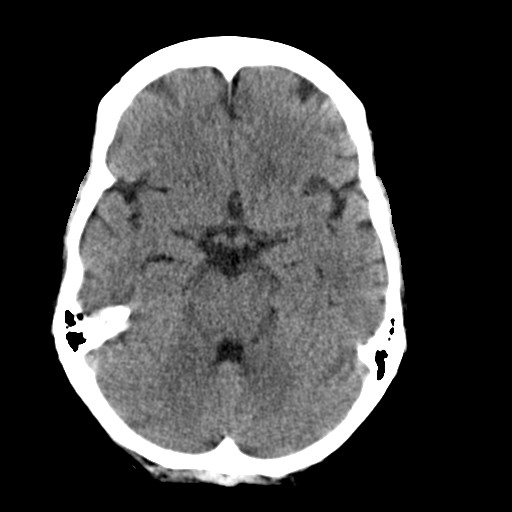
[im 9/30  brain]
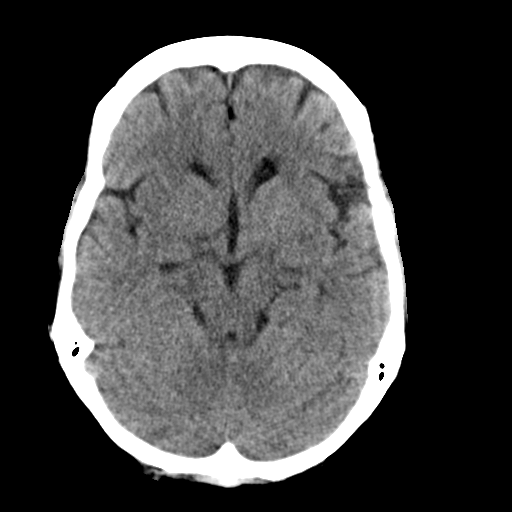
[im 9/30  bone]
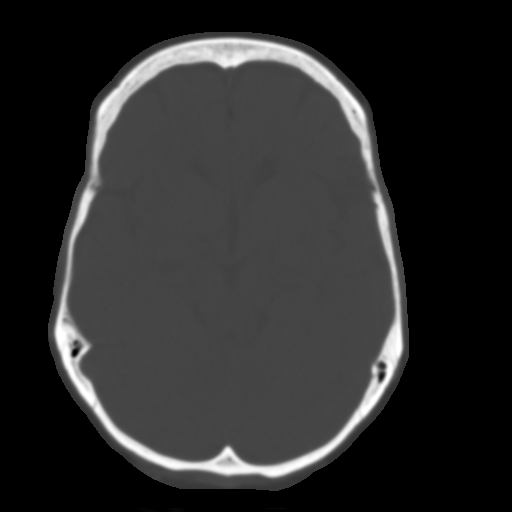
[im 11/30  brain]
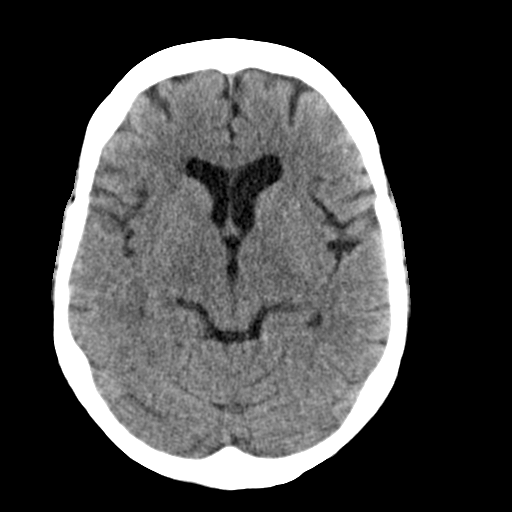
[im 13/30  brain]
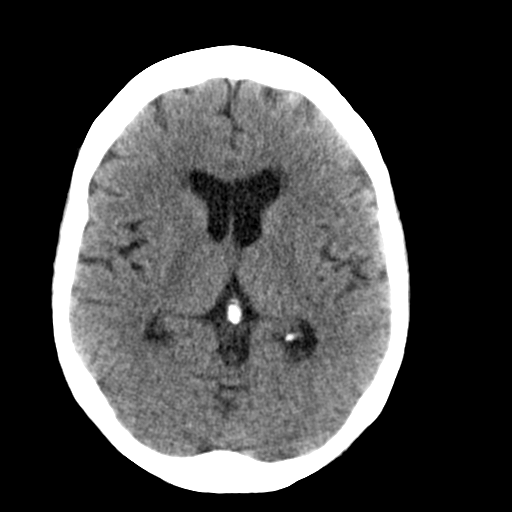
[im 15/30  brain]
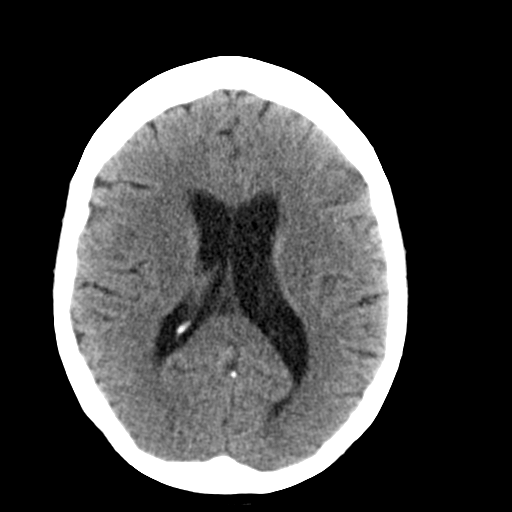
[im 16/30  brain]
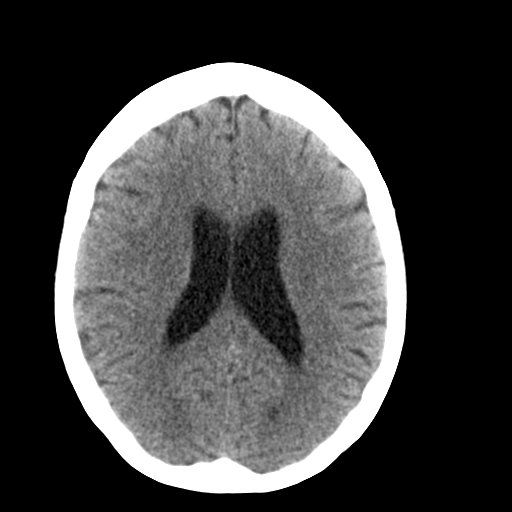
[im 16/30  bone]
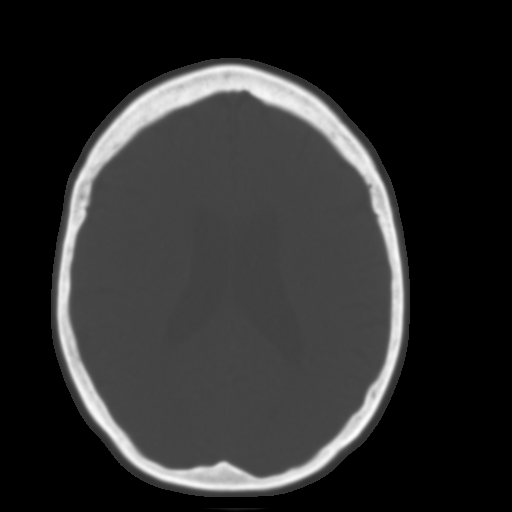
[im 18/30  brain]
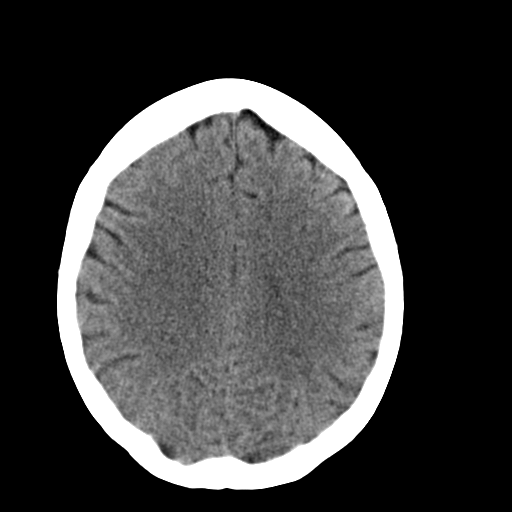
[im 20/30  brain]
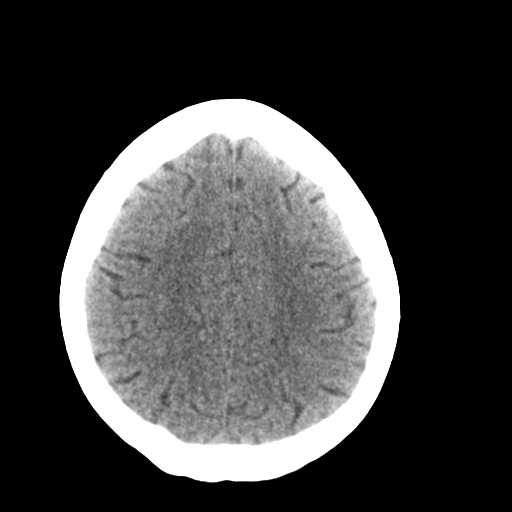
[im 22/30  brain]
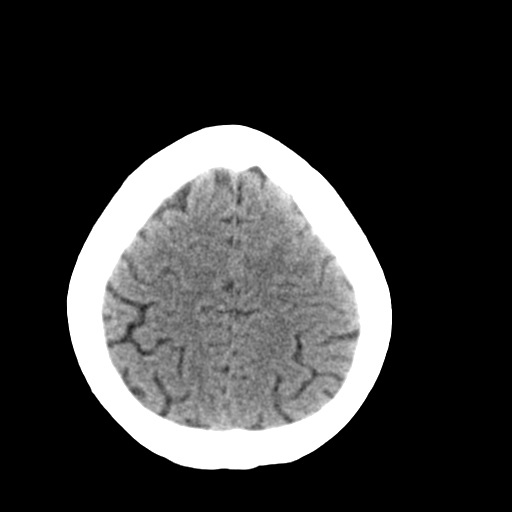
[im 23/30  brain]
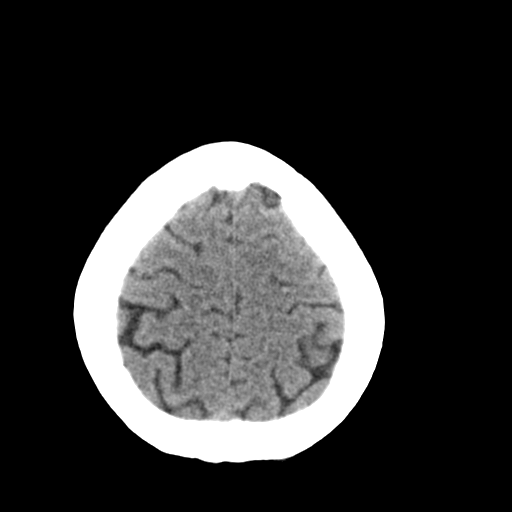
[im 23/30  bone]
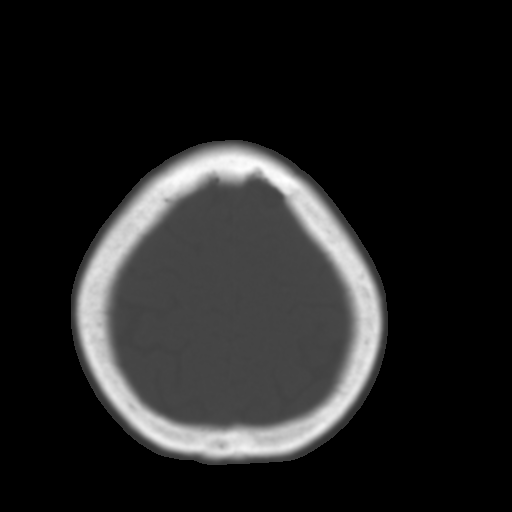
[im 25/30  brain]
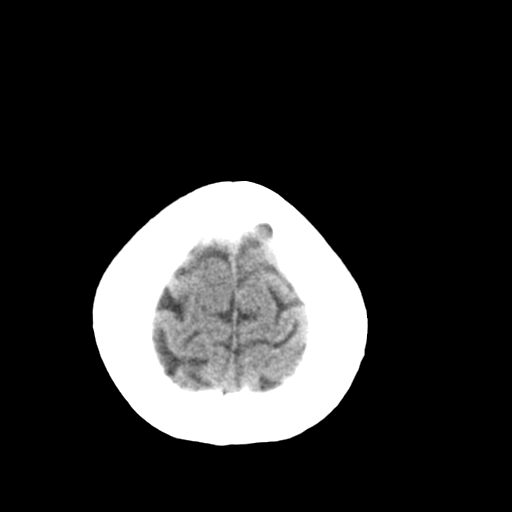
[im 27/30  brain]
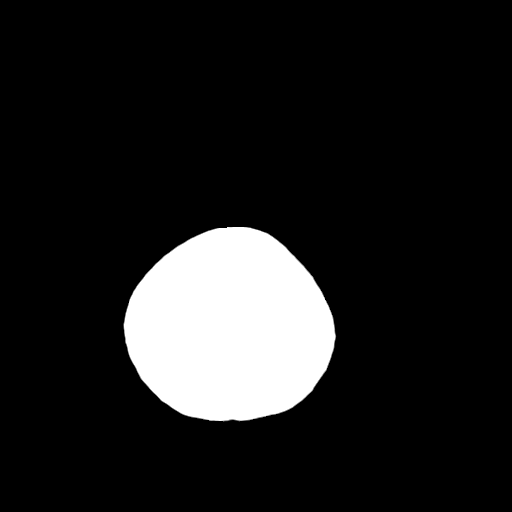
[im 29/30  brain]
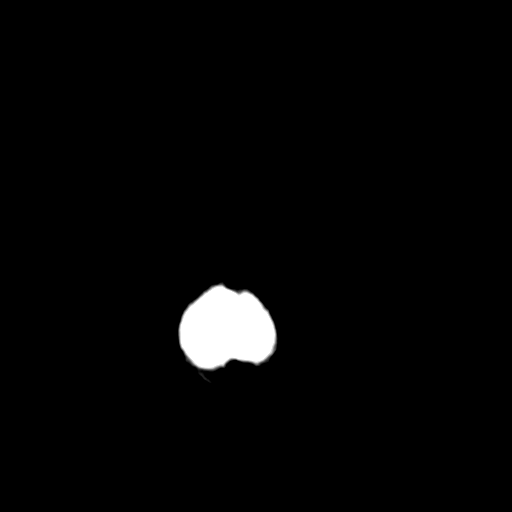

[16 of 30 positions shown; findings below may reference images not displayed]

FINDINGS: Negative for acute intracranial hemorrhage, acute infarction, mass,
mass effect, hydrocephalus or midline shift. Gray-white
differentiation is preserved throughout. No acute soft tissue or
calvarial abnormality. The globes and orbits are symmetric and
unremarkable. Normal aeration of the mastoid air cells and
visualized paranasal sinuses.
IMPRESSION: Negative head CT.

## 2017-06-20 ENCOUNTER — Ambulatory Visit: Payer: Medicare HMO | Admitting: Adult Health

## 2017-07-30 ENCOUNTER — Encounter: Payer: Self-pay | Admitting: Adult Health

## 2017-07-31 ENCOUNTER — Encounter: Payer: Self-pay | Admitting: Adult Health

## 2017-07-31 ENCOUNTER — Ambulatory Visit: Payer: Medicare HMO | Admitting: Adult Health

## 2017-07-31 VITALS — BP 104/64 | HR 85 | Ht 63.0 in | Wt 172.0 lb

## 2017-07-31 DIAGNOSIS — Z9989 Dependence on other enabling machines and devices: Secondary | ICD-10-CM | POA: Diagnosis not present

## 2017-07-31 DIAGNOSIS — G47419 Narcolepsy without cataplexy: Secondary | ICD-10-CM | POA: Diagnosis not present

## 2017-07-31 DIAGNOSIS — G4733 Obstructive sleep apnea (adult) (pediatric): Secondary | ICD-10-CM | POA: Diagnosis not present

## 2017-07-31 MED ORDER — LISDEXAMFETAMINE DIMESYLATE 20 MG PO CAPS: 20.0000 mg | ORAL_CAPSULE | Freq: Every day | ORAL | 0 refills | Status: DC

## 2017-07-31 NOTE — Patient Instructions (Addendum)
Your Plan:  Continue to use CPAP nightly and >4 hours each night Start Vyvanse 20 mg daily If your symptoms worsen or you develop new symptoms please let us know.   Thank you for coming to see [place] at [facility]. I hope we have been able to provide you high quality care today.  You may receive a patient satisfaction survey over the next few weeks. We would appreciate your feedback and comments so that we may continue to improve ourselves and the health of our patients.

## 2017-07-31 NOTE — Progress Notes (Signed)
PATIENT: Jamie Murillo DOB: 24-Nov-1950  REASON FOR VISIT: follow up HISTORY FROM: patient  HISTORY OF PRESENT ILLNESS: Today 07/31/17:  Jamie Murillo is a 67 year old female with a history of obstructive sleep apnea on CPAP and narcolepsy.  She returns today for follow-up.  Marland Kitchen  Her CPAP download indicates that she used her machine 30 out of 30 days for compliance of 100%.  She used her machine greater than 4 hours 14 out of 30 days for compliance of 47%.  On average she uses her machine 3 hours and 49 minutes.  Her residual AHI is 3.1 on 5-15 centimeters of water with EPR 3.  Her leak in the 95th percentile is 49.8 L/min.  She states that there are some nights she does not sleep well and does not sleep longer than 4 hours.  She states that she was on Adderall but did not find it beneficial and her husband complained that she was grouchy.  She stopped the medication back in March.  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  She states that in the past she saw a psychiatrist who had her on Vyvanse and reports that this worked well for her.  She is currently not seeing a psychiatrist but plans to get reestablished in the future.  She returns today for evaluation.    HISTORY02/20/19 Jamie Murillo is a 67 year old female with a history of obstructive sleep apnea on CPAP and narcolepsy.  She returns today for follow-up.  At the last visit she was switched from Adderall to Provigil but reports that she cannot tolerate Provigil and did not find it beneficial.  She states that she is struggling with daytime sleepiness.  She reports that she was recently at work sitting and a chair and fell asleep and fell backwards causing her to have a concussion.  She did go to the emergency room.  The patient CPAP download indicates that she use her machine 30 out of 30 days for compliance of 100%.  She use her machine greater than 4 hours only 7 out of 30 days for  compliance of 23%.  On average she uses her machine 3 hours and 7 minutes.  She is on AutoSet with a minimum pressure of 5 degrees of water and maximum pressure of 15 cm of water with EPR of 2.  Her residual AHI is 3.7.  She has a leak in the 95th percentile at 66.5 L/min.  She returns today for evaluation.    REVIEW OF SYSTEMS: Out of a complete 14 system review of symptoms, the patient complains only of the following symptoms, and all other reviewed systems are negative.  See HPI  ALLERGIES: Allergies  Allergen Reactions  . Bee Venom Anaphylaxis  . Desvenlafaxine Rash, Shortness Of Breath and Anaphylaxis  . Diltiazem Hcl Anaphylaxis  . Neomycin Anaphylaxis  . Other Anaphylaxis    pistacchios  . Pistachio Nut Extract Skin Test Anaphylaxis  . Pneumococcal Polysaccharide Vaccine Dermatitis, Rash and Swelling  . Prednisone Anaphylaxis  . Tetracycline Anaphylaxis  . Latex Rash  . Ampicillin Hives    Then anaphylaxsis  . Erythromycin Hives    Then anaphylaxsis  . Influenza Vaccines     Patient has a history of anaphylaxis to neomycin and other agents of that class.    . Lamotrigine Hives  . Molds & Smuts Other (See Comments)  . Penicillins Nausea And Vomiting  . Pneumococcal Vaccine Hives  .  Septra [Sulfamethoxazole-Trimethoprim]     Leads to anaphylaxsis  . Buspirone Rash    Leads to anaphylaxsis  . Cortisone Rash  . Imipramine Rash  . Statins Rash    Statin induced myopathy  . Tape Rash    BUSPAR. BUSPAR SEPTRA. SEPTRA  . Ziprasidone Hcl Rash    HOME MEDICATIONS: Outpatient Medications Prior to Visit  Medication Sig Dispense Refill  . albuterol (PROAIR HFA) 108 (90 BASE) MCG/ACT inhaler Inhale 1-2 puffs into the lungs every 4 (four) hours as needed for wheezing or shortness of breath. 1 Inhaler 3  . albuterol (PROVENTIL) (2.5 MG/3ML) 0.083% nebulizer solution Inhale into the lungs. Reported on 02/03/2015    . allopurinol (ZYLOPRIM) 100 MG tablet Take 100 mg by mouth.      . Alpha-Lipoic Acid 300 MG CAPS Take by mouth.    . Cholecalciferol (D-5000) 5000 units TABS Take by mouth.    . estradiol (ESTRACE) 0.5 MG tablet Take 0.5 mg by mouth daily.   0  . furosemide (LASIX) 40 MG tablet Take 40 mg by mouth daily.  0  . gabapentin (NEURONTIN) 300 MG capsule Take 300 mg by mouth at bedtime.   0  . levothyroxine (SYNTHROID, LEVOTHROID) 25 MCG tablet Take by mouth.    . losartan (COZAAR) 50 MG tablet Take by mouth.    . metoprolol succinate (TOPROL-XL) 50 MG 24 hr tablet Take 1 tablet (50 mg total) by mouth daily. 90 tablet 3  . montelukast (SINGULAIR) 10 MG tablet   0  . pantoprazole (PROTONIX) 40 MG tablet Take 40 mg by mouth daily.  0  . pramipexole (MIRAPEX) 1 MG tablet   0  . SYMBICORT 160-4.5 MCG/ACT inhaler inhale 2 puffs by mouth twice a day 10.2 Inhaler 2  . amphetamine-dextroamphetamine (ADDERALL) 10 MG tablet Take 1 tablet (10 mg total) by mouth daily with breakfast. (Patient not taking: Reported on 07/31/2017) 30 tablet 0  . modafinil (PROVIGIL) 200 MG tablet Take 1 tablet (200 mg total) by mouth daily. (Patient not taking: Reported on 02/27/2017) 30 tablet 5   No facility-administered medications prior to visit.     PAST MEDICAL HISTORY: Past Medical History:  Diagnosis Date  . Adult attention deficit disorder   . Anemia   . Bipolar disorder ([place])   . Cancer ([place])    skin  . CHF NYHA class I (HCC)    chronic, diastolic  . Chronic asthma   . Chronic renal impairment, stage 3 (moderate) (HCC)   . COPD (chronic obstructive pulmonary disease) ([place])   . Coronary artery disease with history of myocardial infarction without history of CABG   . Depression   . Diabetes mellitus without complication ([place])   . Dysrhythmia   . Fibromyalgia   . Fibromyalgia   . GERD without esophagitis   . Gout   . Gout   . Heart murmur   . Hepatitis B   . Hepatitis C   . History of chronic hepatitis    Hepatitis B and C  . Hyperlipidemia   . Hypertension   .  Hypothyroidism, adult   . 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 Obstructive sleep apnea, adult    CPAP  . 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 Patent foramen ovale   . Postural orthostatic tachycardia syndrome   . Raynaud's disease   . Raynaud's disease without gangrene   . Renal insufficiency    2008 per pt  .  Restless leg syndrome, controlled   . Rosacea   . Secondary hyperparathyroidism, renal ([place])   . Statin intolerance   . Surgical menopause on hormone replacement therapy   . Syncope and collapse     PAST SURGICAL HISTORY: Past Surgical History:  Procedure Laterality Date  . ABDOMINAL HYSTERECTOMY    . APPENDECTOMY    . bilateral tubal ligation    . BLADDER SURGERY     "repair"  . BREAST BIOPSY Right    negative 3 different biopsies  . BREAST SURGERY    . BUNIONECTOMY Bilateral   . CARDIAC CATHETERIZATION    . COLONOSCOPY N/A 12/06/2014   Procedure: COLONOSCOPY;  Surgeon: Manya Silvas, MD;  Location: [facility] ENDOSCOPY;  Service: Endoscopy;  Laterality: N/A;  . ESOPHAGOGASTRODUODENOSCOPY (EGD) WITH PROPOFOL N/A 02/14/2015   Procedure: ESOPHAGOGASTRODUODENOSCOPY (EGD) WITH PROPOFOL;  Surgeon: Manya Silvas, MD;  Location: [facility] ENDOSCOPY;  Service: Endoscopy;  Laterality: N/A;  . FOOT SURGERY Bilateral   . PELVIC FLOOR REPAIR    . TUBAL LIGATION      FAMILY HISTORY: Family History  Problem Relation Age of Onset  . Hypertension Mother   . Hypothyroidism Mother   . Heart disease Mother   . Lung cancer Mother   . Alcohol abuse Mother   . Depression Mother   . Hypertension Father   . Heart disease Father   . Alcohol abuse Father   . Hypothyroidism Sister   . Hypertension Sister   . Bipolar disorder Sister   . Breast cancer Sister 93  . Heart disease Brother   . Alcohol abuse Brother   . Bipolar disorder Brother   . Hypothyroidism Sister   . Hypertension Sister   . Prostate cancer Brother   . Other  Brother   . Prostate cancer Brother   . Breast cancer Sister     SOCIAL HISTORY: Social History   Socioeconomic History  . Marital status: Married    Spouse name: Philippa Chester  . Number of children: 3  . Years of education: 53  . Highest education level: Not on file  Occupational History  . Occupation: unemployed  Social Needs  . Financial resource strain: Not on file  . Food insecurity:    Worry: Not on file    Inability: Not on file  . Transportation needs:    Medical: Not on file    Non-medical: Not on file  Tobacco Use  . Smoking status: Never Smoker  . Smokeless tobacco: Never Used  . Tobacco comment: tried smoking in youth  Substance and Sexual Activity  . Alcohol use: No    Alcohol/week: 0.0 oz    Comment: used when she was 67 years old.   . Drug use: Yes    Types: LSD, Marijuana    Comment: 07/21/14 "tried in youth"  . Sexual activity: Yes    Birth control/protection: None  Lifestyle  . Physical activity:    Days per week: Not on file    Minutes per session: Not on file  . Stress: Not on file  Relationships  . Social connections:    Talks on phone: Not on file    Gets together: Not on file    Attends religious service: Not on file    Active member of club or organization: Not on file    Attends meetings of clubs or organizations: Not on file    Relationship status: Not on file  . Intimate partner violence:    Fear of current or  ex partner: Not on file    Emotionally abused: Not on file    Physically abused: Not on file    Forced sexual activity: Not on file  Other Topics Concern  . Not on file  Social History Narrative   Married, lives at home with husband   Caffeine use- 1 soda, 1 tea daily      PHYSICAL EXAM  Vitals:   07/31/17 1021  BP: 104/64  Pulse: 85  Weight: 172 lb (78 kg)  Height: 5\' 3"  (1.6 m)   Body mass index is 30.47 kg/m.  Generalized: Well developed, in no acute distress   Neurological examination  Mentation: Alert oriented  to time, place, history taking. Follows all commands speech and language fluent Cranial nerve II-XII: Pupils were equal round reactive to light. Extraocular movements were full, visual field were full on confrontational test. Facial sensation and strength were normal. Uvula tongue midline. Head turning and shoulder shrug  were normal and symmetric. Motor: The motor testing reveals 5 over 5 strength of all 4 extremities. Good symmetric motor tone is noted throughout.  Sensory: Sensory testing is intact to soft touch on all 4 extremities. No evidence of extinction is noted.  Gait and station: Gait is normal.    DIAGNOSTIC DATA (LABS, IMAGING, TESTING) - I reviewed patient records, labs, notes, testing and imaging myself where available.  Lab Results  Component Value Date   WBC 15.0 (H) 01/15/2017   HGB 13.5 01/15/2017   HCT 39.6 01/15/2017   MCV 95.3 01/15/2017   PLT 280 01/15/2017      Component Value Date/Time   NA 140 01/15/2017 1800   K 4.6 01/15/2017 1800   CL 103 01/15/2017 1800   CO2 28 01/15/2017 1800   GLUCOSE 121 (H) 01/15/2017 1800   BUN 50 (H) 01/15/2017 1800   CREATININE 1.76 (H) 01/15/2017 1800   CALCIUM 9.6 01/15/2017 1800   PROT 6.7 03/07/2016 1257   ALBUMIN 3.8 03/07/2016 1257   AST 23 03/07/2016 1257   ALT 17 03/07/2016 1257   ALKPHOS 79 03/07/2016 1257   BILITOT 0.3 03/07/2016 1257   GFRNONAA 29 (L) 01/15/2017 1800   GFRAA 34 (L) 01/15/2017 1800       ASSESSMENT AND PLAN 67 y.o. year old female  has a past medical history of Adult attention deficit disorder, Anemia, Bipolar disorder ([place]), Cancer ([place]), CHF NYHA class I ([place]), Chronic asthma, Chronic renal impairment, stage 3 (moderate) ([place]), COPD (chronic obstructive pulmonary disease) ([place]), Coronary artery disease with history of myocardial infarction without history of CABG, Depression, Diabetes mellitus without complication ([place]), Dysrhythmia, Fibromyalgia, Fibromyalgia, GERD without esophagitis, Gout,  Gout, Heart murmur, Hepatitis B, Hepatitis C, History of chronic hepatitis, Hyperlipidemia, Hypertension, Hypothyroidism, adult, Narcolepsy due to underlying condition without cataplexy, Obstructive sleep apnea, adult, Osteoarthritis of spine without myelopathy or radiculopathy, Patent foramen ovale, Postural orthostatic tachycardia syndrome, Raynaud's disease, Raynaud's disease without gangrene, Renal insufficiency, Restless leg syndrome, controlled, Rosacea, Secondary hyperparathyroidism, renal ([place]), Statin intolerance, Surgical menopause on hormone replacement therapy, and Syncope and collapse. here with:  1.  Obstructive sleep apnea on CPAP 2.  Narcolepsy  The patient CPAP download shows suboptimal compliance.  I encouraged the patient to try to use machine greater than 4 hours each night.  She will be started on Vyvanse 20 mg daily.  I discussed with Dr. Brett Fairy and she is in agreement.  I have advised the patient that if her symptoms worsen or she develops new symptoms she should let  us know.  She will follow-up in 6 months or sooner if needed.   I spent 15 minutes with the patient. 50% of this time was spent reviewing her CPAP download   Ward Givens, MSN, NP-C 07/31/2017, 10:35 AM [facility] [address], [place], [place] [address] [phone]

## 2017-08-05 ENCOUNTER — Telehealth: Payer: Self-pay | Admitting: *Deleted

## 2017-08-05 NOTE — Telephone Encounter (Signed)
PA for Vyvanse started on CMM.

## 2017-08-06 NOTE — Telephone Encounter (Signed)
Vyvanse approved: PA Case: [id number], Coverage Starts 08/05/2017 12:00:00 AM, Ends 08/05/2019 12:00:00 AM. Questions? Contact [phone]. Key [id number] Tried/failed: Adderall- ineffective, "grouchy", Provigil caused side effects

## 2017-08-13 ENCOUNTER — Encounter: Payer: Self-pay | Admitting: Adult Health

## 2017-09-11 NOTE — Progress Notes (Signed)
I agree with the assessment and plan as directed by NP .The patient is known to me .   Keavon Sensing, MD  

## 2017-11-22 ENCOUNTER — Ambulatory Visit: Payer: Medicare HMO | Attending: Neurology

## 2017-11-22 DIAGNOSIS — G4733 Obstructive sleep apnea (adult) (pediatric): Secondary | ICD-10-CM | POA: Diagnosis not present

## 2017-12-11 IMAGING — MR MR HEAD W/O CM
10 series · 48 of 48 positions shown · non-contrast
Comparison: CT head 01/22/2016.

CLINICAL DATA: Blurry vision.  Unable to focus.  Off balance.

EXAM:
MRI HEAD WITHOUT CONTRAST
TECHNIQUE: Multiplanar, multiecho pulse sequences of the brain and surrounding
structures were obtained without intravenous contrast.

[Series 2: T1 · sagittal · 5.0mm · 0.45mm/px · 3 of 25 slices shown (1 of 2)]
[im 1/25]
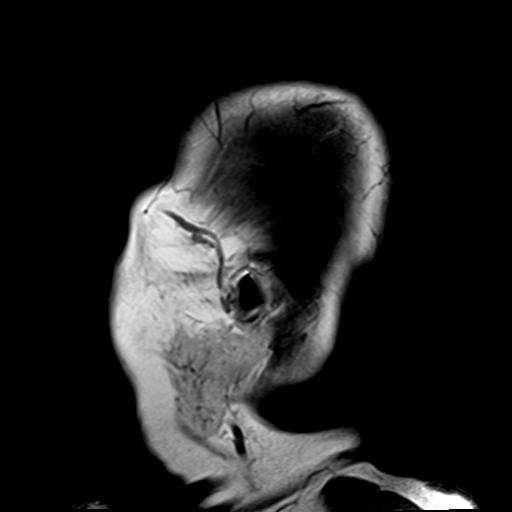
[im 13/25]
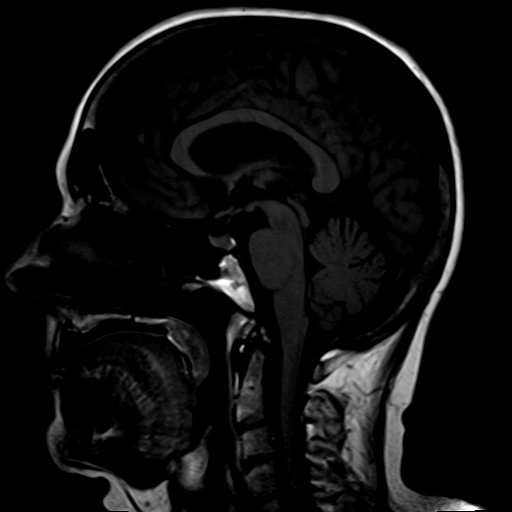
[im 25/25]
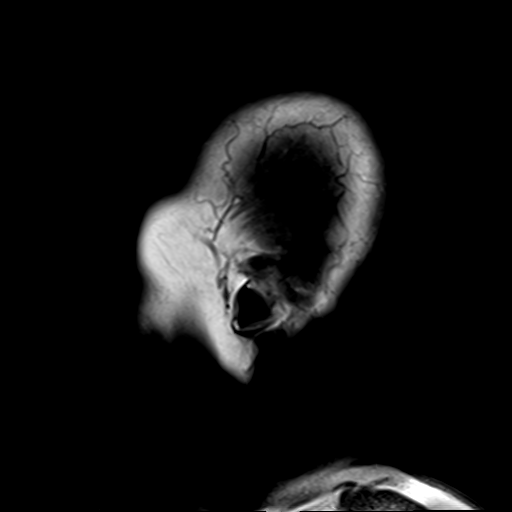

[Series 4: DWI · axial · 3.0mm · 1.80mm/px · z∈[-50,+109]mm · 7 of 54 slices shown (1 of 4)]
[im 1/54]
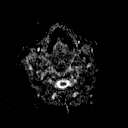
[im 9/54]
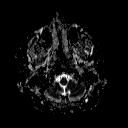
[im 18/54]
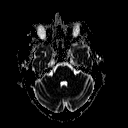
[im 27/54]
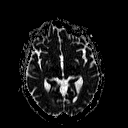
[im 36/54]
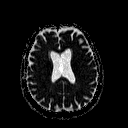
[im 45/54]
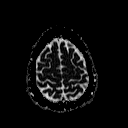
[im 54/54]
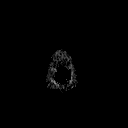

[Series 6: DWI · coronal · 3.0mm · 1.80mm/px · 6 of 45 slices shown (2 of 4)]
[im 1/45]
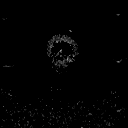
[im 9/45]
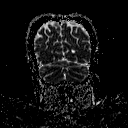
[im 18/45]
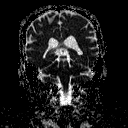
[im 27/45]
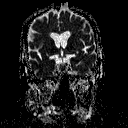
[im 36/45]
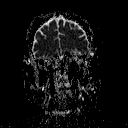
[im 45/45]
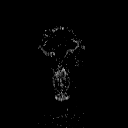

[Series 7: T2 · axial · 5.0mm · 0.60mm/px · z∈[-46,+107]mm · 3 of 25 slices shown (1 of 3)]
[im 1/25]
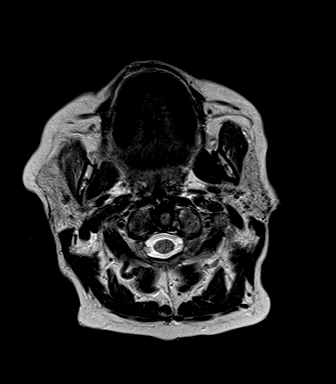
[im 13/25]
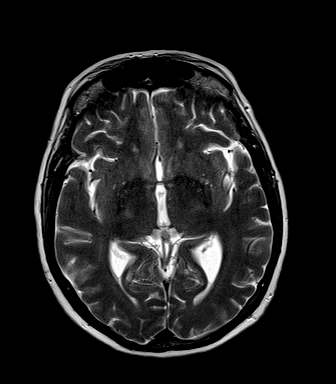
[im 25/25]
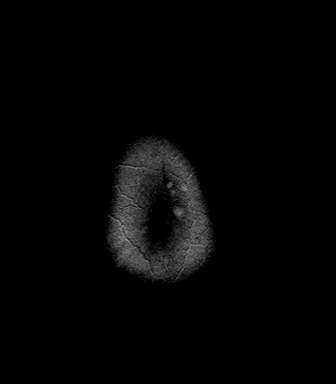

[Series 8: FLAIR · axial · 5.0mm · 0.45mm/px · z∈[-46,+107]mm · 3 of 25 slices shown]
[im 1/25]
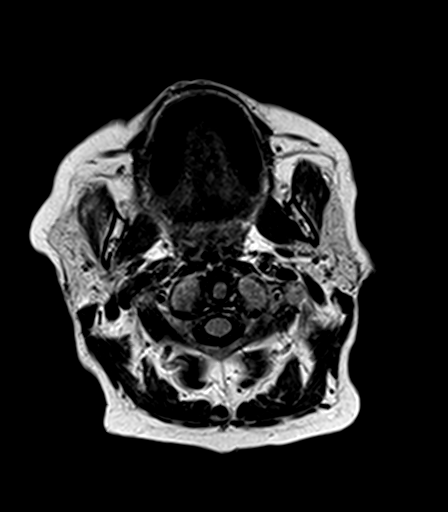
[im 13/25]
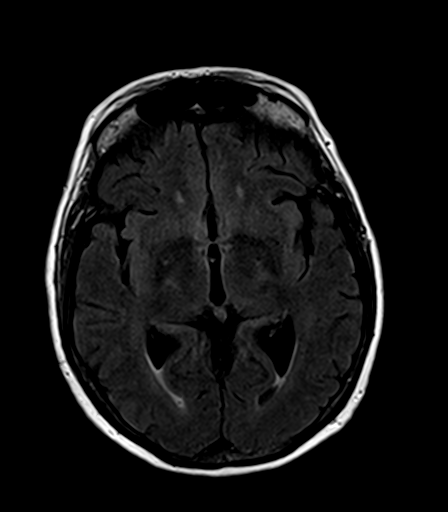
[im 25/25]
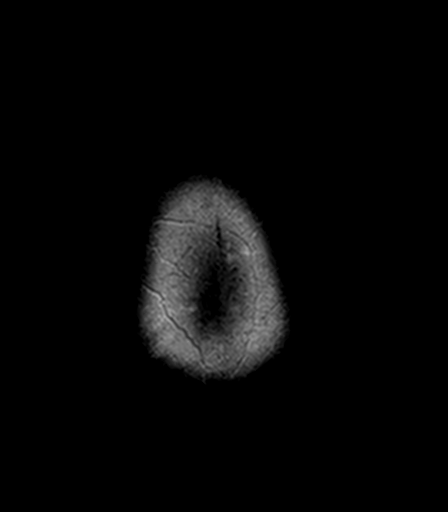

[Series 9: T2 · axial · 5.0mm · 0.45mm/px · z∈[-46,+107]mm · 3 of 25 slices shown (2 of 3)]
[im 1/25]
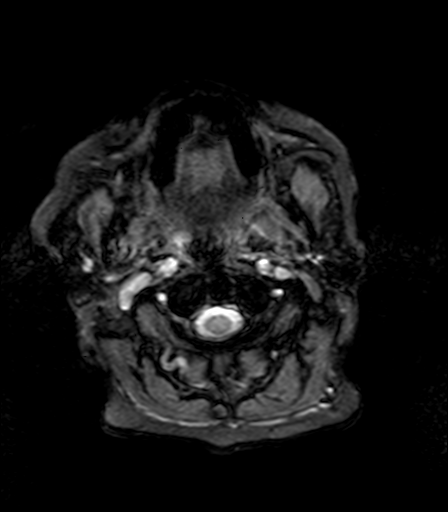
[im 13/25]
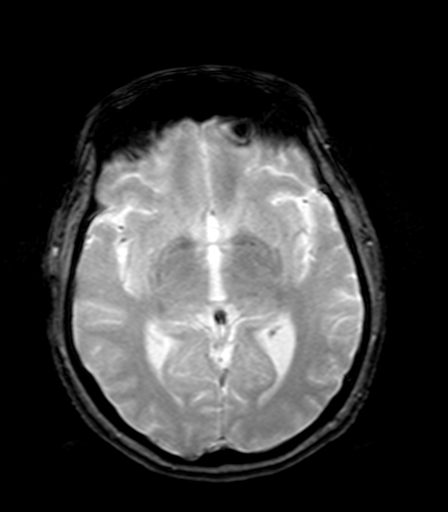
[im 25/25]
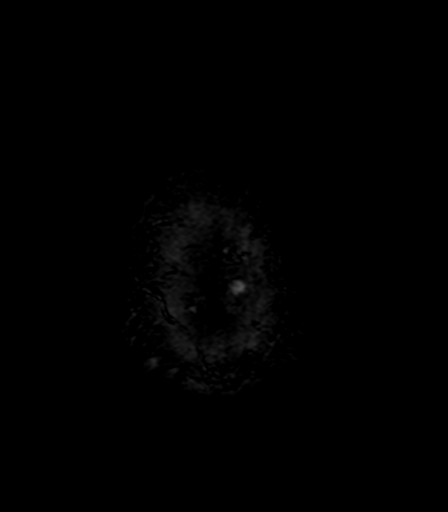

[Series 10: T1 · axial · 3.0mm · 1.00mm/px · z∈[-54,+120]mm · 7 of 60 slices shown (2 of 2)]
[im 1/60]
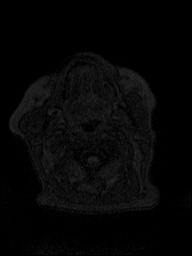
[im 10/60]
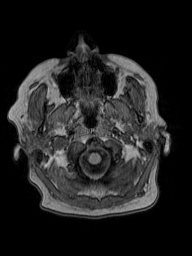
[im 20/60]
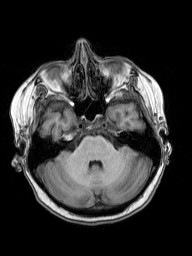
[im 30/60]
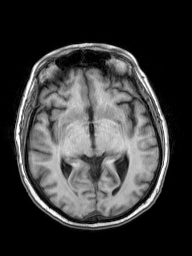
[im 40/60]
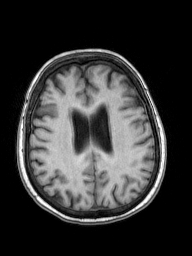
[im 50/60]
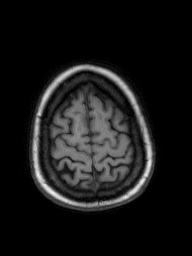
[im 60/60]
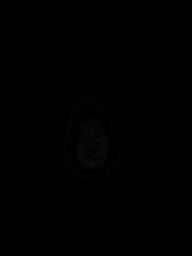

[Series 11: T2 · coronal · 5.0mm · 0.49mm/px · 3 of 27 slices shown (3 of 3)]
[im 1/27]
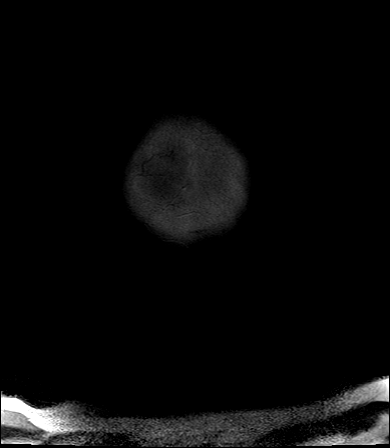
[im 14/27]
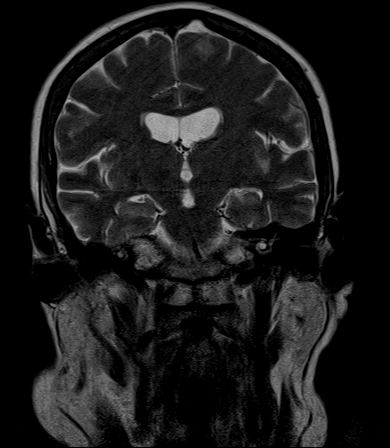
[im 27/27]
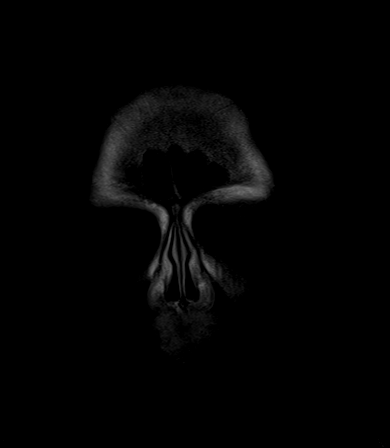

[Series 100: DWI · axial · 3.0mm · 1.80mm/px · z∈[-50,+109]mm · 7 of 55 slices shown (3 of 4)]
[im 1/55]
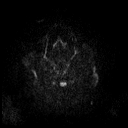
[im 10/55]
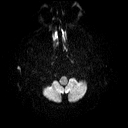
[im 19/55]
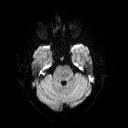
[im 28/55]
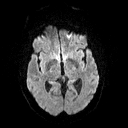
[im 37/55]
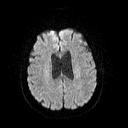
[im 46/55]
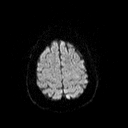
[im 55/55]
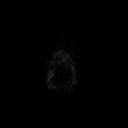

[Series 101: DWI · coronal · 3.0mm · 1.80mm/px · 6 of 45 slices shown (4 of 4)]
[im 1/45]
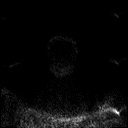
[im 9/45]
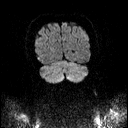
[im 18/45]
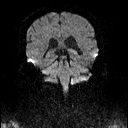
[im 27/45]
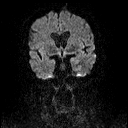
[im 36/45]
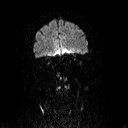
[im 45/45]
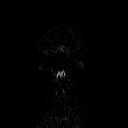

[48 of 48 positions shown; findings below may reference images not displayed]

FINDINGS: Brain: No evidence for acute infarction, hemorrhage, mass lesion,
hydrocephalus, or extra-axial fluid. Normal for age cerebral volume.
Minor subcortical and periventricular T2 and FLAIR hyperintensities,
likely chronic microvascular ischemic change.

Vascular: Flow voids are maintained throughout the carotid, basilar,
and vertebral arteries. There are no areas of chronic hemorrhage.

Skull and upper cervical spine: Normal marrow signal.

Sinuses/Orbits: Negative.

Other: Compared with earlier CT, good general agreement.
IMPRESSION: No acute intracranial abnormality.  Minor white matter disease.

No cause seen for the reported symptoms.

## 2018-02-03 ENCOUNTER — Ambulatory Visit: Payer: Medicare HMO | Admitting: Neurology

## 2019-11-09 ENCOUNTER — Ambulatory Visit: Payer: Medicare HMO | Attending: Internal Medicine

## 2019-11-09 DIAGNOSIS — Z23 Encounter for immunization: Secondary | ICD-10-CM

## 2019-11-09 NOTE — Progress Notes (Signed)
   Covid-[facility] Vaccination Clinic  Name:  Jamie Murillo    MRN: [id number] DOB: [birth year]  11/09/2019  [name] was observed post Covid-19 immunization for 15 minutes without incident. She was provided with Vaccine Information Sheet and instruction to access the V-Safe system.   [name] was instructed to call [phone] with any severe reactions post vaccine: Marland Kitchen Difficulty breathing  . Swelling of face and throat  . A fast heartbeat  . A bad rash all over body  . Dizziness and weakness

## 2021-12-27 ENCOUNTER — Emergency Department: Payer: Medicare HMO

## 2021-12-27 ENCOUNTER — Other Ambulatory Visit: Payer: Self-pay

## 2021-12-27 ENCOUNTER — Observation Stay
Admission: EM | Admit: 2021-12-27 | Discharge: 2021-12-28 | Disposition: A | Discharge status: Home / Self Care | Payer: Medicare HMO | Attending: Family Medicine | Admitting: Family Medicine

## 2021-12-27 ENCOUNTER — Encounter: Payer: Self-pay | Admitting: Emergency Medicine

## 2021-12-27 DIAGNOSIS — K224 Dyskinesia of esophagus: Secondary | ICD-10-CM | POA: Insufficient documentation

## 2021-12-27 DIAGNOSIS — N1832 Chronic kidney disease, stage 3b: Secondary | ICD-10-CM | POA: Insufficient documentation

## 2021-12-27 DIAGNOSIS — Z1152 Encounter for screening for COVID-19: Secondary | ICD-10-CM | POA: Insufficient documentation

## 2021-12-27 DIAGNOSIS — J45909 Unspecified asthma, uncomplicated: Secondary | ICD-10-CM | POA: Insufficient documentation

## 2021-12-27 DIAGNOSIS — Z91040 Latex allergy status: Secondary | ICD-10-CM | POA: Insufficient documentation

## 2021-12-27 DIAGNOSIS — E1122 Type 2 diabetes mellitus with diabetic chronic kidney disease: Secondary | ICD-10-CM | POA: Diagnosis not present

## 2021-12-27 DIAGNOSIS — R29818 Other symptoms and signs involving the nervous system: Secondary | ICD-10-CM | POA: Insufficient documentation

## 2021-12-27 DIAGNOSIS — Z79899 Other long term (current) drug therapy: Secondary | ICD-10-CM | POA: Insufficient documentation

## 2021-12-27 DIAGNOSIS — G4733 Obstructive sleep apnea (adult) (pediatric): Secondary | ICD-10-CM

## 2021-12-27 DIAGNOSIS — I5032 Chronic diastolic (congestive) heart failure: Secondary | ICD-10-CM | POA: Diagnosis not present

## 2021-12-27 DIAGNOSIS — I130 Hypertensive heart and chronic kidney disease with heart failure and stage 1 through stage 4 chronic kidney disease, or unspecified chronic kidney disease: Secondary | ICD-10-CM | POA: Diagnosis not present

## 2021-12-27 DIAGNOSIS — I161 Hypertensive emergency: Principal | ICD-10-CM | POA: Insufficient documentation

## 2021-12-27 DIAGNOSIS — F329 Major depressive disorder, single episode, unspecified: Secondary | ICD-10-CM | POA: Diagnosis present

## 2021-12-27 DIAGNOSIS — R296 Repeated falls: Secondary | ICD-10-CM

## 2021-12-27 DIAGNOSIS — I2510 Atherosclerotic heart disease of native coronary artery without angina pectoris: Secondary | ICD-10-CM | POA: Diagnosis not present

## 2021-12-27 DIAGNOSIS — Z85828 Personal history of other malignant neoplasm of skin: Secondary | ICD-10-CM | POA: Diagnosis not present

## 2021-12-27 DIAGNOSIS — J449 Chronic obstructive pulmonary disease, unspecified: Secondary | ICD-10-CM | POA: Diagnosis not present

## 2021-12-27 DIAGNOSIS — I160 Hypertensive urgency: Secondary | ICD-10-CM | POA: Diagnosis not present

## 2021-12-27 DIAGNOSIS — E039 Hypothyroidism, unspecified: Secondary | ICD-10-CM | POA: Insufficient documentation

## 2021-12-27 DIAGNOSIS — R519 Headache, unspecified: Secondary | ICD-10-CM | POA: Diagnosis present

## 2021-12-27 LAB — COMPREHENSIVE METABOLIC PANEL
ALT: 19 U/L (ref 0–44)
AST: 22 U/L (ref 15–41)
Albumin: 4.2 g/dL (ref 3.5–5.0)
Alkaline Phosphatase: 98 U/L (ref 38–126)
Anion gap: 10 (ref 5–15)
BUN: 26 mg/dL — ABNORMAL HIGH (ref 8–23)
CO2: 25 mmol/L (ref 22–32)
Calcium: 8.9 mg/dL (ref 8.9–10.3)
Chloride: 107 mmol/L (ref 98–111)
Creatinine, Ser: 1.3 mg/dL — ABNORMAL HIGH (ref 0.44–1.00)
GFR, Estimated: 44 mL/min — ABNORMAL LOW (ref >60)
Glucose, Bld: 93 mg/dL (ref 70–99)
Potassium: 4.9 mmol/L (ref 3.5–5.1)
Sodium: 142 mmol/L (ref 135–145)
Total Bilirubin: 0.7 mg/dL (ref 0.3–1.2)
Total Protein: 7.3 g/dL (ref 6.5–8.1)

## 2021-12-27 LAB — CBC
HCT: 39.9 % (ref 36.0–46.0)
Hemoglobin: 13 g/dL (ref 12.0–15.0)
MCH: 31.9 pg (ref 26.0–34.0)
MCHC: 32.6 g/dL (ref 30.0–36.0)
MCV: 97.8 fL (ref 80.0–100.0)
Platelets: 221 10*3/uL (ref 150–400)
RBC: 4.08 MIL/uL (ref 3.87–5.11)
RDW: 13.9 % (ref 11.5–15.5)
WBC: 5.8 10*3/uL (ref 4.0–10.5)
nRBC: 0 % (ref 0.0–0.2)

## 2021-12-27 LAB — TROPONIN I (HIGH SENSITIVITY)
Troponin I (High Sensitivity): 4 ng/L (ref <18)
Troponin I (High Sensitivity): 5 ng/L (ref <18)

## 2021-12-27 LAB — APTT: aPTT: 26 seconds (ref 24–36)

## 2021-12-27 LAB — PROTIME-INR
INR: 1 (ref 0.8–1.2)
Prothrombin Time: 12.6 seconds (ref 11.4–15.2)

## 2021-12-27 MED ORDER — CLONIDINE HCL 0.1 MG PO TABS
0.1000 mg | ORAL_TABLET | ORAL | Status: AC
  Administered 2021-12-27: 0.1 mg via ORAL
  Filled 2021-12-27: qty 1

## 2021-12-27 MED ORDER — LABETALOL HCL 5 MG/ML IV SOLN
10.0000 mg | Freq: Once | INTRAVENOUS | Status: DC
  Filled 2021-12-27: qty 4

## 2021-12-27 MED ORDER — OXYCODONE-ACETAMINOPHEN 5-325 MG PO TABS
1.0000 | ORAL_TABLET | ORAL | Status: AC
  Administered 2021-12-27: 1 via ORAL
  Filled 2021-12-27: qty 1

## 2021-12-27 NOTE — H&P (Signed)
History and Physical    Patient: Jamie Murillo ZOX:[id number] DOB: [birth year] DOA: 12/27/2021 DOS: the patient was seen and examined on 12/27/2021 PCP: Bland Span, MD  Patient coming from: Home  Chief Complaint:  Chief Complaint  Patient presents with   Headache   Chest Pain    HPI: Jamie Murillo is a 71 y.o. female with medical history significant for COPD, OSA on CPAP, CKD 3B, diastolic CHF, major depressive disorder, esophageal dysmotility and frequent falls who presents to the ED for evaluation of headache, transient confusion and slurred speech and elevated blood pressures at home.  Except for headache symptoms had for the most part resolved by arrival.  She denied visual disturbance, one-sided weakness numbness or tingling, chest pain, palpitations or shortness of breath.  Denies nausea, vomiting, fever or chills or cough.  Chart review reveals that patient is currently undergoing extensive workup for her frequent falls ordered by her gerontologist on 12/13.  Due to concern for polypharmacy, her blood pressure medication was adjusted and her metoprolol dose was decreased in half due to a concern for orthostasis.  Due to elevated blood pressures at home, patient doubled up on her medication over the course of the last couple days. ED course and data review: BP 217/86 on arrival with otherwise normal vitals.  CBC, CMP, troponin unremarkable, with baseline renal function with creatinine of 1.30. EKG, personally viewed and interpreted showing sinus at 64 with no ischemic ST-T wave changes. Chest x-ray clear Head CT showing no acute intracranial abnormality but showed cerebral atrophy and microvascular disease changes of the supratentorial brain. Patient was treated with clonidine 0.1 mg in the ED with improvement in blood pressure to 176/73. Hospitalist consulted for admission.   Review of Systems: As mentioned in the history of present illness. All other systems reviewed and  are negative.  Past Medical History:  Diagnosis Date   Adult attention deficit disorder    Anemia    Bipolar disorder ([place])    Cancer (HCC)    skin   CHF NYHA class I ([place])    chronic, diastolic   Chronic asthma    Chronic renal impairment, stage 3 (moderate) (HCC)    COPD (chronic obstructive pulmonary disease) (HCC)    Coronary artery disease with history of myocardial infarction without history of CABG    Depression    Diabetes mellitus without complication (HCC)    Dysrhythmia    Fibromyalgia    Fibromyalgia    GERD without esophagitis    Gout    Gout    Heart murmur    Hepatitis B    Hepatitis C    History of chronic hepatitis    Hepatitis B and C   Hyperlipidemia    Hypertension    Hypothyroidism, adult    Narcolepsy due to underlying condition without cataplexy    Obstructive sleep apnea, adult    CPAP   Osteoarthritis of spine without myelopathy or radiculopathy    Patent foramen ovale    Postural orthostatic tachycardia syndrome    Raynaud's disease    Raynaud's disease without gangrene    Renal insufficiency    2008 per pt   Restless leg syndrome, controlled    Rosacea    Secondary hyperparathyroidism, renal (HCC)    Statin intolerance    Surgical menopause on hormone replacement therapy    Syncope and collapse    Past Surgical History:  Procedure Laterality Date   ABDOMINAL HYSTERECTOMY     APPENDECTOMY  bilateral tubal ligation     BLADDER SURGERY     "repair"   BREAST BIOPSY Right    negative 3 different biopsies   BREAST SURGERY     BUNIONECTOMY Bilateral    CARDIAC CATHETERIZATION     COLONOSCOPY N/A 12/06/2014   Procedure: COLONOSCOPY;  Surgeon: Manya Silvas, MD;  Location: [facility] ENDOSCOPY;  Service: Endoscopy;  Laterality: N/A;   ESOPHAGOGASTRODUODENOSCOPY (EGD) WITH PROPOFOL N/A 02/14/2015   Procedure: ESOPHAGOGASTRODUODENOSCOPY (EGD) WITH PROPOFOL;  Surgeon: Manya Silvas, MD;  Location: [facility] ENDOSCOPY;  Service: Endoscopy;   Laterality: N/A;   FOOT SURGERY Bilateral    PELVIC FLOOR REPAIR     TUBAL LIGATION     Social History:  reports that she has never smoked. She has never used smokeless tobacco. She reports current drug use. Drugs: LSD and Marijuana. She reports that she does not drink alcohol.  Allergies  Allergen Reactions   Bee Venom Anaphylaxis   Desvenlafaxine Rash, Shortness Of Breath and Anaphylaxis   Diltiazem Hcl Anaphylaxis   Neomycin Anaphylaxis   Other Anaphylaxis    pistacchios   [place] Nut Extract Skin Test Anaphylaxis   Pneumococcal Polysaccharide Vaccine Dermatitis, Rash and Swelling   Prednisone Anaphylaxis   Tetracycline Anaphylaxis   Latex Rash   Ampicillin Hives    Then anaphylaxsis   Erythromycin Hives    Then anaphylaxsis   Influenza Vaccines     Patient has a history of anaphylaxis to neomycin and other agents of that class.     Lamotrigine Hives   Molds & Smuts Other (See Comments)   Penicillins Nausea And Vomiting   Pneumococcal Vaccine Hives   Septra [Sulfamethoxazole-Trimethoprim]     Leads to anaphylaxsis   Buspirone Rash    Leads to anaphylaxsis   Cortisone Rash   Imipramine Rash   Statins Rash    Statin induced myopathy   Tape Rash    BUSPAR. BUSPAR SEPTRA. SEPTRA   Ziprasidone Hcl Rash    Family History  Problem Relation Age of Onset   Hypertension Mother    Hypothyroidism Mother    Heart disease Mother    Lung cancer Mother    Alcohol abuse Mother    Depression Mother    Hypertension Father    Heart disease Father    Alcohol abuse Father    Hypothyroidism Sister    Hypertension Sister    Bipolar disorder Sister    Breast cancer Sister 30   Heart disease Brother    Alcohol abuse Brother    Bipolar disorder Brother    Hypothyroidism Sister    Hypertension Sister    Prostate cancer Brother    Other Brother    Prostate cancer Brother    Breast cancer Sister     Prior to Admission medications   Medication Sig Start Date End Date  Taking? Authorizing Provider  albuterol (PROAIR HFA) 108 (90 BASE) MCG/ACT inhaler Inhale 1-2 puffs into the lungs every 4 (four) hours as needed for wheezing or shortness of breath. 08/12/14   Bobetta Lime, MD  albuterol (PROVENTIL) (2.5 MG/3ML) 0.083% nebulizer solution Inhale into the lungs. Reported on 02/03/2015    [provider]  allopurinol (ZYLOPRIM) 100 MG tablet Take 100 mg by mouth.     [provider]  Alpha-Lipoic Acid 300 MG CAPS Take by mouth.    [provider]  amphetamine-dextroamphetamine (ADDERALL) 10 MG tablet Take 1 tablet (10 mg total) by mouth daily with breakfast. Patient not taking: Reported on  07/31/2017 03/27/17   Ward Givens, NP  Cholecalciferol (D-5000) 5000 units TABS Take by mouth. 01/11/17   [provider]  estradiol (ESTRACE) 0.5 MG tablet Take 0.5 mg by mouth daily.  06/13/14   [provider]  furosemide (LASIX) 40 MG tablet Take 40 mg by mouth daily. 09/15/14   [provider]  gabapentin (NEURONTIN) 300 MG capsule Take 300 mg by mouth at bedtime.  06/21/14   [provider]  levothyroxine (SYNTHROID, LEVOTHROID) 25 MCG tablet Take by mouth.    [provider]  lisdexamfetamine (VYVANSE) 20 MG capsule Take 1 capsule (20 mg total) by mouth daily. 07/31/17   Ward Givens, NP  losartan (COZAAR) 50 MG tablet Take by mouth. 03/18/14   [provider]  metoprolol succinate (TOPROL-XL) 50 MG 24 hr tablet Take 1 tablet (50 mg total) by mouth daily. 07/23/14   Bobetta Lime, MD  modafinil (PROVIGIL) 200 MG tablet Take 1 tablet (200 mg total) by mouth daily. Patient not taking: Reported on 02/27/2017 09/06/16   Dohmeier, Asencion Partridge, MD  montelukast (SINGULAIR) 10 MG tablet  09/12/14   [provider]  pantoprazole (PROTONIX) 40 MG tablet Take 40 mg by mouth daily. 01/07/15   [provider]  pramipexole (MIRAPEX) 1 MG tablet  09/12/14   [provider]  [facility] 160-4.5  MCG/ACT inhaler inhale 2 puffs by mouth twice a day 12/08/14   Bobetta Lime, MD    Physical Exam: Vitals:   12/27/21 1733 12/27/21 1736 12/27/21 2303  BP:  (!) 217/86 (!) 176/73  Pulse:  64   Resp:  18   Temp:  97.8 F (36.6 C)   TempSrc:  Oral   SpO2:  98%   Weight: 74.8 kg    Height: '5\' 2"'$  (1.575 m)     Physical Exam Vitals and nursing note reviewed.  Constitutional:      General: She is not in acute distress. HENT:     Head: Normocephalic and atraumatic.  Cardiovascular:     Rate and Rhythm: Normal rate and regular rhythm.     Heart sounds: Normal heart sounds.  Pulmonary:     Effort: Pulmonary effort is normal.     Breath sounds: Normal breath sounds.  Abdominal:     Palpations: Abdomen is soft.     Tenderness: There is no abdominal tenderness.  Neurological:     Mental Status: Mental status is at baseline.     Labs on Admission: I have personally reviewed following labs and imaging studies  CBC: Recent Labs  Lab 12/27/21 1742  WBC 5.8  HGB 13.0  HCT 39.9  MCV 97.8  PLT 338   Basic Metabolic Panel: Recent Labs  Lab 12/27/21 1742  NA 142  K 4.9  CL 107  CO2 25  GLUCOSE 93  BUN 26*  CREATININE 1.30*  CALCIUM 8.9   GFR: Estimated Creatinine Clearance: 37.6 mL/min (A) (by C-G formula based on SCr of 1.3 mg/dL (H)). Liver Function Tests: Recent Labs  Lab 12/27/21 1742  AST 22  ALT 19  ALKPHOS 98  BILITOT 0.7  PROT 7.3  ALBUMIN 4.2   No results for input(s): "LIPASE", "AMYLASE" in the last 168 hours. No results for input(s): "AMMONIA" in the last 168 hours. Coagulation Profile: Recent Labs  Lab 12/27/21 2242  INR 1.0   Cardiac Enzymes: No results for input(s): "CKTOTAL", "CKMB", "CKMBINDEX", "TROPONINI" in the last 168 hours. BNP (last 3 results) No results for input(s): "PROBNP" in the last  8760 hours. HbA1C: No results for input(s): "HGBA1C" in the last 72 hours. CBG: No results for input(s): "GLUCAP" in the last 168  hours. Lipid Profile: No results for input(s): "CHOL", "HDL", "LDLCALC", "TRIG", "CHOLHDL", "LDLDIRECT" in the last 72 hours. Thyroid Function Tests: No results for input(s): "TSH", "T4TOTAL", "FREET4", "T3FREE", "THYROIDAB" in the last 72 hours. Anemia Panel: No results for input(s): "VITAMINB12", "FOLATE", "FERRITIN", "TIBC", "IRON", "RETICCTPCT" in the last 72 hours. Urine analysis:    Component Value Date/Time   COLORURINE YELLOW (A) 01/15/2017 1802   APPEARANCEUR CLEAR (A) 01/15/2017 1802   LABSPEC 1.013 01/15/2017 1802   PHURINE 5.0 01/15/2017 1802   GLUCOSEU NEGATIVE 01/15/2017 1802   HGBUR NEGATIVE 01/15/2017 1802   BILIRUBINUR NEGATIVE 01/15/2017 1802   KETONESUR NEGATIVE 01/15/2017 1802   PROTEINUR NEGATIVE 01/15/2017 1802   NITRITE NEGATIVE 01/15/2017 1802   LEUKOCYTESUR NEGATIVE 01/15/2017 1802    Radiological Exams on Admission: DG Chest 2 View  Result Date: 12/27/2021 CLINICAL DATA:  cp/sob EXAM: CHEST - 2 VIEW COMPARISON:  05/05/2016. FINDINGS: The heart size and mediastinal contours are within normal limits. Both lungs are clear. No pneumothorax or pleural effusion. There are thoracic degenerative changes. IMPRESSION: No active cardiopulmonary disease. Electronically Signed   By: Sammie Bench M.D.   On: 12/27/2021 18:03   CT Head Wo Contrast  Result Date: 12/27/2021 CLINICAL DATA:  History of hypertension with chest pain and headache. EXAM: CT HEAD WITHOUT CONTRAST TECHNIQUE: Contiguous axial images were obtained from the base of the skull through the vertex without intravenous contrast. RADIATION DOSE REDUCTION: This exam was performed according to the departmental dose-optimization program which includes automated exposure control, adjustment of the mA and/or kV according to patient size and/or use of iterative reconstruction technique. COMPARISON:  January 22, 2016 FINDINGS: Brain: There is mild cerebral atrophy with widening of the extra-axial spaces and  ventricular dilatation. There are areas of decreased attenuation within the white matter tracts of the supratentorial brain, consistent with microvascular disease changes. Vascular: No hyperdense vessel or unexpected calcification. Skull: Normal. Negative for fracture or focal lesion. Sinuses/Orbits: No acute finding. Other: None. IMPRESSION: 1. No acute intracranial abnormality. 2. Generalized cerebral atrophy and microvascular disease changes of the supratentorial brain. Electronically Signed   By: Virgina Norfolk M.D.   On: 12/27/2021 17:55     Data Reviewed: Relevant notes from primary care and specialist visits, past discharge summaries as available in EHR, including Care Everywhere. Prior diagnostic testing as pertinent to current admission diagnoses Updated medications and problem lists for reconciliation ED course, including vitals, labs, imaging, treatment and response to treatment Triage notes, nursing and pharmacy notes and ED provider's notes Notable results as noted in HPI   Assessment and Plan: * Hypertensive emergency without congestive heart failure Transient neurologic deficit, possible TIA Presents with headache, also had slurred speech and confusion improved by arrival BP 217/86 on arrival improving to 176/73 with clonidine Head CT nonacute Continue losartan and metoprolol Will get MRI, echo, carotid Dopplers (patient had these planned as outpatient for evaluation of frequent falls) Headache pain control  Esophageal dysmotility Continue Protonix  Frequent falls PT eval and fall precautions  Major depressive disorder To resume home meds pending med rec  Chronic obstructive pulmonary disease ([place]) Not acutely exacerbated Continue Symbicort equivalent with DuoNebs as needed  OSA on CPAP CPAP nightly  Stage 3b chronic kidney disease ([place]) Renal function at baseline  Chronic diastolic heart failure (HCC) Clinically euvolemic Continue furosemide losartan and  metoprolol Follow-up  echocardiogram        DVT prophylaxis: Lovenox  Consults: neurology, Dr Quinn Axe  Advance Care Planning: full code  Family Communication: none  Disposition Plan: Back to previous home environment  Severity of Illness: The appropriate patient status for this patient is INPATIENT. Inpatient status is judged to be reasonable and necessary in order to provide the required intensity of service to ensure the patient's safety. The patient's presenting symptoms, physical exam findings, and initial radiographic and laboratory data in the context of their chronic comorbidities is felt to place them at high risk for further clinical deterioration. Furthermore, it is not anticipated that the patient will be medically stable for discharge from the hospital within 2 midnights of admission.   * I certify that at the point of admission it is my clinical judgment that the patient will require inpatient hospital care spanning beyond 2 midnights from the point of admission due to high intensity of service, high risk for further deterioration and high frequency of surveillance required.*  Author: Athena Masse, MD 12/27/2021 11:58 PM  For on call review [email or web address].

## 2021-12-27 NOTE — ED Provider Notes (Signed)
[facility] Provider Note    None    (approximate)   History   Headache and Chest Pain   HPI  Jamie Murillo is a 71 y.o. female on review of neurology notes has a history of obstructive sleep apnea and narcolepsy.  He also has a history of hypertension, treated at [facility].   She currently reports she is taking hydrochlorothiazide, losartan, and metoprolol.  Today she began experiencing a pounding headache a feeling of throbbing in her face head and neck, and chest pressure.  She also briefly felt like her vision seemed a little bit off and she reportedly felt slightly confused her family felt like she just did not quite seem to be quite like her normal self.   She checks her blood pressure at home and found to be 200.  She is also experiencing a moderate throbbing to pounding headache.  The headache did not come on immediately abruptly, but seem to be worsening associated with a pounding feeling and chest tightness also experienced  She has taken her prescription she took a double dose of her losartan, she reports she is taken approximately 200 mg of losartan today.  She took her metoprolol as scheduled yesterday evening, and also was taking her hydrochlorothiazide  No numbness or weakness.  Family reported to her that she seems little off a little confused earlier but that has improved.  Husband at bedside.  Continues to have a mild sense of chest pressure but that is improving but continues to have a moderate pounding to throbbing headache  No abdominal pain.  No pain in her back.  No fevers or chills.  Denies neck pain  Physical Exam   Triage Vital Signs: ED Triage Vitals  Enc Vitals Group     BP 12/27/21 1736 (!) 217/86     Pulse Rate 12/27/21 1736 64     Resp 12/27/21 1736 18     Temp 12/27/21 1736 97.8 F (36.6 C)     Temp Source 12/27/21 1736 Oral     SpO2 12/27/21 1736 98 %     Weight 12/27/21 1733 165 lb (74.8 kg)     Height 12/27/21 1733 5'  2" (1.575 m)     Head Circumference --      Peak Flow --      Pain Score 12/27/21 1732 8     Pain Loc --      Pain Edu? --      Excl. in [place]? --     Most recent vital signs: Vitals:   12/27/21 1736 12/27/21 2303  BP: (!) 217/86 (!) 176/73  Pulse: 64   Resp: 18   Temp: 97.8 F (36.6 C)   SpO2: 98%      General: Awake, no distress.  Does appear somewhat uncomfortable reports a throbbing headache.  Reports she feels like she can hear her blood pressure heart pounding CV:  Good peripheral perfusion.  Normal heart tones rate variable 55-60 Resp:  Normal effort.  Clear bilateral Abd:  No distention.  Other:  Was all extremities well.  Follows all commands no pronator drift.  Extraocular movements are normal.  Facial droop.  She is currently alert and well-oriented without slurred speech, no confusion at present though family reports she seemed slightly confused earlier but this has resolved.   ED Results / Procedures / Treatments   Labs (all labs ordered are listed, but only abnormal results are displayed) Labs Reviewed  COMPREHENSIVE METABOLIC PANEL -  Abnormal; Notable for the following components:      Result Value   BUN 26 (*)    Creatinine, Ser 1.30 (*)    GFR, Estimated 44 (*)    All other components within normal limits  RESP PANEL BY RT-PCR (RSV, FLU A&B, COVID)  RVPGX2  CBC  PROTIME-INR  APTT  TROPONIN I (HIGH SENSITIVITY)  TROPONIN I (HIGH SENSITIVITY)     EKG  Interpreted by me at 1735 heart rate 65 QRS 80 QTc 420 Normal sinus rhythm, slight first-degree AV block.  No evidence of acute ischemia or ectopy, with exception to a very slight inversion of V1 and V2 which may be normal variant.   RADIOLOGY  DG Chest 2 View  Result Date: 12/27/2021 CLINICAL DATA:  cp/sob EXAM: CHEST - 2 VIEW COMPARISON:  05/05/2016. FINDINGS: The heart size and mediastinal contours are within normal limits. Both lungs are clear. No pneumothorax or pleural effusion. There are  thoracic degenerative changes. IMPRESSION: No active cardiopulmonary disease. Electronically Signed   By: Sammie Bench M.D.   On: 12/27/2021 18:03   CT Head Wo Contrast  Result Date: 12/27/2021 CLINICAL DATA:  History of hypertension with chest pain and headache. EXAM: CT HEAD WITHOUT CONTRAST TECHNIQUE: Contiguous axial images were obtained from the base of the skull through the vertex without intravenous contrast. RADIATION DOSE REDUCTION: This exam was performed according to the departmental dose-optimization program which includes automated exposure control, adjustment of the mA and/or kV according to patient size and/or use of iterative reconstruction technique. COMPARISON:  January 22, 2016 FINDINGS: Brain: There is mild cerebral atrophy with widening of the extra-axial spaces and ventricular dilatation. There are areas of decreased attenuation within the white matter tracts of the supratentorial brain, consistent with microvascular disease changes. Vascular: No hyperdense vessel or unexpected calcification. Skull: Normal. Negative for fracture or focal lesion. Sinuses/Orbits: No acute finding. Other: None. IMPRESSION: 1. No acute intracranial abnormality. 2. Generalized cerebral atrophy and microvascular disease changes of the supratentorial brain. Electronically Signed   By: Virgina Norfolk M.D.   On: 12/27/2021 17:55      PROCEDURES:  Critical Care performed: No  Procedures   MEDICATIONS ORDERED IN ED: Medications  oxyCODONE-acetaminophen (PERCOCET/ROXICET) 5-325 MG per tablet 1 tablet (1 tablet Oral Given 12/27/21 2302)  cloNIDine (CATAPRES) tablet 0.1 mg (0.1 mg Oral Given 12/27/21 2303)     IMPRESSION / MDM / ASSESSMENT AND PLAN / ED COURSE  I reviewed the triage vital signs and the nursing notes.                              Differential diagnosis includes, but is not limited to, hypertensive emergency, intracranial hemorrhage, stroke, TIA, posterior reversible  encephalopathy, ACS, hypertensive urgency, aneurysmal rupture etc.  Based on the patient's clinical history it seems she has some fairly significant symptoms including what family reports is a slight confusion or not quite seeming to be herself earlier but this has resolved.  This is associated with a throbbing pounding headache and chest pressure and severe hypertension at home and also on initial evaluation here.  She has contraindication to calcium channel blocker utilization and multiple drug allergies.  Had plan to give metoprolol or labetalol but heart rate resting found to be approximately 50-60, will trial clonidine and pain control with oxycodone.  CT of the head reassuring, no meningismus, no evidence of vascular ruptured intracranial hemorrhage or subarachnoid.  No associated neck  pain to suggest carotid dissection.  No evidence of stroke on CT and her neurologic exam is normal at this time.  I believe the patient is borderline hypertensive emergency but certainly having a hypertensive urgency.  No evidence of ACS.  Labs reveal normal CBC normal comprehensive metabolic panel with exception to slightly elevated creatinine which actually slightly improved from her previous checks here.  Troponin of 4.  Renal artery stenosis or other causes of potential hypertension are considered.  Patient's presentation is most consistent with acute complicated illness / injury requiring diagnostic workup.   The patient is on the cardiac monitor to evaluate for evidence of arrhythmia and/or significant heart rate changes.  Given the severity of symptoms the patient is reporting in her noted hypertension, though she does not have clear evidence of endorgan damage at this time I am concerned about the severity of symptoms and associated confusion and chest pressure.  After receiving clonidine and pain control her blood pressure is improving she is currently at 176/73.     ----------------------------------------- 11:27 PM on 12/27/2021 ----------------------------------------- Blood pressure 181/70, improving.  Patient also reports her headache now has improved to just a mild symptom but she can still feel the pulsation of blood pressure in her ears, both sides. Improving.  Consulted with the hospitalist, and given the patient's severity of hypertension and concerning symptomatology earlier, we will admit her for further care observation and treatment for hypertensive urgency what components that sound and borderline hypertensive emergency earlier  Dr. Damita Dunnings accepting of admission.  Patient and husband understanding agreeable with plan for admission   FINAL CLINICAL IMPRESSION(S) / ED DIAGNOSES   Final diagnoses:  Hypertensive urgency     Rx / DC Orders   ED Discharge Orders     None        Note:  This document was prepared using Dragon voice recognition software and may include unintentional dictation errors.   Delman Kitten, MD 12/27/21 [phone]

## 2021-12-27 NOTE — ED Triage Notes (Signed)
Pt via POV from home. Pt c/o chest pain, headache, and HTN. States the pain is sharp and in under her L chest pain. States she does take HTN medication at home and has not missed any recent doses. Pt is A&Ox4 and NAD.

## 2021-12-27 NOTE — ED Provider Triage Note (Signed)
Emergency Medicine Provider Triage Evaluation Note  SHERREE SHANKMAN , a 71 y.o. female  was evaluated in triage.  Pt complains of chest pain, HA, body aches. Sudden onset of R chest pain and headache at 4. No weakness, slurred speech. At baseline according to family. No HX CVA or MI.  Review of Systems  Positive: HA, CP, joint pain Negative: Unilateral weakness, slurred speech, shob  Physical Exam  BP (!) 217/86 (BP Location: Right Arm)   Pulse 64   Temp 97.8 F (36.6 C) (Oral)   Resp 18   Ht '5\' 2"'$  (1.575 m)   Wt 74.8 kg   SpO2 98%   BMI 30.18 kg/m  Gen:   Awake, no distress   Resp:  Normal effort  MSK:   Moves extremities without difficulty  Other:  CN II-XII intact  Medical Decision Making  Medically screening exam initiated at 6:02 PM.  Appropriate orders placed.  MARLOWE CINQUEMANI was informed that the remainder of the evaluation will be completed by another provider, this initial triage assessment does not replace that evaluation, and the importance of remaining in the ED until their evaluation is complete.  Labs, EKG, chest xray, ct head   Darletta Moll, PA-C 12/27/21 1802

## 2021-12-28 ENCOUNTER — Inpatient Hospital Stay: Payer: Medicare HMO

## 2021-12-28 DIAGNOSIS — I161 Hypertensive emergency: Secondary | ICD-10-CM | POA: Diagnosis not present

## 2021-12-28 LAB — RESP PANEL BY RT-PCR (RSV, FLU A&B, COVID)  RVPGX2
Influenza A by PCR: NEGATIVE
Influenza B by PCR: NEGATIVE
Resp Syncytial Virus by PCR: NEGATIVE
SARS Coronavirus 2 by RT PCR: NEGATIVE

## 2021-12-28 LAB — HEMOGLOBIN A1C
Hgb A1c MFr Bld: 6 % — ABNORMAL HIGH (ref 4.8–5.6)
Mean Plasma Glucose: 126 mg/dL

## 2021-12-28 MED ORDER — LEVOTHYROXINE SODIUM 100 MCG PO TABS: 100.0000 ug | ORAL_TABLET | Freq: Every day | ORAL | Status: DC

## 2021-12-28 MED ORDER — STROKE: EARLY STAGES OF RECOVERY BOOK: Freq: Once | Status: DC

## 2021-12-28 MED ORDER — HYDROCODONE-ACETAMINOPHEN 5-325 MG PO TABS: 1.0000 | ORAL_TABLET | ORAL | Status: DC | PRN

## 2021-12-28 MED ORDER — ACETAMINOPHEN 160 MG/5ML PO SOLN: 650.0000 mg | ORAL | Status: DC | PRN

## 2021-12-28 MED ORDER — ALLOPURINOL 100 MG PO TABS
100.0000 mg | ORAL_TABLET | Freq: Every day | ORAL | Status: DC
  Filled 2021-12-28: qty 1

## 2021-12-28 MED ORDER — ONDANSETRON HCL 4 MG/2ML IJ SOLN: 4.0000 mg | Freq: Four times a day (QID) | INTRAMUSCULAR | Status: DC | PRN

## 2021-12-28 MED ORDER — ACETAMINOPHEN 325 MG PO TABS
650.0000 mg | ORAL_TABLET | ORAL | Status: DC | PRN
  Administered 2021-12-28: 650 mg via ORAL
  Filled 2021-12-28: qty 2

## 2021-12-28 MED ORDER — MORPHINE SULFATE (PF) 2 MG/ML IV SOLN: 2.0000 mg | INTRAVENOUS | Status: DC | PRN

## 2021-12-28 MED ORDER — FUROSEMIDE 40 MG PO TABS: 40.0000 mg | ORAL_TABLET | Freq: Every day | ORAL | Status: DC

## 2021-12-28 MED ORDER — MOMETASONE FURO-FORMOTEROL FUM 200-5 MCG/ACT IN AERO
2.0000 | INHALATION_SPRAY | Freq: Two times a day (BID) | RESPIRATORY_TRACT | Status: DC
  Administered 2021-12-28: 2 via RESPIRATORY_TRACT
  Filled 2021-12-28: qty 8.8

## 2021-12-28 MED ORDER — PANTOPRAZOLE SODIUM 40 MG PO TBEC: 40.0000 mg | DELAYED_RELEASE_TABLET | Freq: Every day | ORAL | Status: DC

## 2021-12-28 MED ORDER — ENOXAPARIN SODIUM 40 MG/0.4ML IJ SOSY
40.0000 mg | PREFILLED_SYRINGE | INTRAMUSCULAR | Status: DC
  Administered 2021-12-28: 40 mg via SUBCUTANEOUS
  Filled 2021-12-28: qty 0.4

## 2021-12-28 MED ORDER — LOSARTAN POTASSIUM 50 MG PO TABS: 50.0000 mg | ORAL_TABLET | Freq: Every day | ORAL | Status: DC

## 2021-12-28 MED ORDER — ALBUTEROL SULFATE (2.5 MG/3ML) 0.083% IN NEBU: 2.5000 mg | INHALATION_SOLUTION | RESPIRATORY_TRACT | Status: DC | PRN

## 2021-12-28 MED ORDER — ONDANSETRON HCL 4 MG PO TABS: 4.0000 mg | ORAL_TABLET | Freq: Four times a day (QID) | ORAL | Status: DC | PRN

## 2021-12-28 MED ORDER — ACETAMINOPHEN 650 MG RE SUPP: 650.0000 mg | RECTAL | Status: DC | PRN

## 2021-12-28 NOTE — Assessment & Plan Note (Signed)
PT eval and fall precautions

## 2021-12-28 NOTE — ED Notes (Signed)
Pt in bed, pt awake and alert, pt is sinus brady, pt brady down to 39, md notified, pt awake and moving all extremities

## 2021-12-28 NOTE — ED Notes (Signed)
Lights turned off and door fully shut at pt's request, bed locked low, rail up and call bell within reach.

## 2021-12-28 NOTE — Assessment & Plan Note (Addendum)
CPAP nightly

## 2021-12-28 NOTE — Progress Notes (Signed)
SLP Cancellation Note  Patient Details Name: Jamie Murillo MRN: [id number] DOB: January 01, 1951   Cancelled treatment:       Reason Eval/Treat Not Completed: SLP screened, no needs identified, will sign off   SLP consult received and appreciated. Chart review completed. Spoke with pt and husband. Both deny changes to speech, language, cognition, and swallowing. SLP to sign off.  Cherrie Gauze, M.S., [facility] [phone] [facility] Quintella Baton 12/28/2021, 10:57 AM

## 2021-12-28 NOTE — ED Notes (Signed)
Pt up to restroom at 0530, complains of left arm pain and upper back pain after exerting self to restroom, EKG obtained, secure chat message sent to brenda morrison to see if additional labs needed this am. Pt states pain is 6/10.

## 2021-12-28 NOTE — ED Notes (Signed)
Patient transported to MRI and then to [place] room 31

## 2021-12-28 NOTE — Progress Notes (Signed)
       CROSS COVER NOTE  NAME: SKYLEN DANIELSEN MRN: [id number] DOB : 15-Jul-1950    HPI/Events of Note   Nurse reports asymptomatic bradycardia in 53s while asleep Patient then reported 6/10 arm and back pain.  EKG performed per standard order by nurse  Assessment and  Interventions   Assessment: EKG with bradycardia and first degree AV block - no new Plan: Hold all rate controlling meds BB, CCB - no active orders found on review Continue to monitor on tele Give acetaminophen  for her mild discomfort       Kathlene Cote NP [facility]

## 2021-12-28 NOTE — Assessment & Plan Note (Signed)
To resume home meds pending med rec

## 2021-12-28 NOTE — Evaluation (Signed)
Physical Therapy Evaluation & Discharge Patient Details Name: Jamie Murillo MRN: [id number] DOB: November 07, 1950 Today's Date: 12/28/2021  History of Present Illness  71 y/o female presented to ED on 12/27/21 for chest pain, headache, and elevated BP. PMH: COPD, OSA on CPAP, CKD 3B, CHF, depression, frequent falls, HTN, bipolar disorder, Raynaud's disease  Clinical Impression  Patient admitted with the above. PTA, patient lives with husband and was independent but reports 4 falls in past month, however includes that these have been from seated position on toilet at night where she has fallen asleep. Patient functioning at independent level with no AD. She reports being at her baseline functionally. No further skilled PT needs identified. No PT follow up recommended at this time.        Recommendations for follow up therapy are one component of a multi-disciplinary discharge planning process, led by the attending physician.  Recommendations may be updated based on patient status, additional functional criteria and insurance authorization.  Follow Up Recommendations No PT follow up      Assistance Recommended at Discharge PRN  Patient can return home with the following       Equipment Recommendations None recommended by PT  Recommendations for Other Services       Functional Status Assessment Patient has not had a recent decline in their functional status     Precautions / Restrictions Precautions Precautions: Fall Restrictions Weight Bearing Restrictions: No      Mobility  Bed Mobility Overal bed mobility: Independent                  Transfers Overall transfer level: Independent Equipment used: None                    Ambulation/Gait Ambulation/Gait assistance: Independent Gait Distance (Feet): 250 Feet Assistive device: None Gait Pattern/deviations: WFL(Within Functional Limits) Gait velocity: decreased     General Gait Details: no overt LOB.  Reports mild dizziness with BP 166/59 at end of mobility  Stairs            Wheelchair Mobility    Modified Rankin (Stroke Patients Only)       Balance Overall balance assessment: History of Falls, Mild deficits observed, not formally tested                                           Pertinent Vitals/Pain Pain Assessment Pain Assessment: No/denies pain    Home Living Family/patient expects to be discharged to:: Private residence Living Arrangements: Spouse/significant other Available Help at Discharge: Family Type of Home: House Home Access: Stairs to enter   Technical brewer of Steps: 4   Home Layout: One level Home Equipment: None      Prior Function Prior Level of Function : Independent/Modified Independent;Driving;History of Falls (last six months)             Mobility Comments: drives short distances, hx of falls but states they are while sitting on toilet at night and she tends to fall asleep. ADLs Comments: completes independently     Hand Dominance        Extremity/Trunk Assessment   Upper Extremity Assessment Upper Extremity Assessment: Overall WFL for tasks assessed    Lower Extremity Assessment Lower Extremity Assessment: Overall WFL for tasks assessed    Cervical / Trunk Assessment Cervical / Trunk Assessment: Normal  Communication   Communication:  No difficulties  Cognition Arousal/Alertness: Awake/alert Behavior During Therapy: WFL for tasks assessed/performed Overall Cognitive Status: Within Functional Limits for tasks assessed                                          General Comments      Exercises     Assessment/Plan    PT Assessment Patient does not need any further PT services  PT Problem List         PT Treatment Interventions      PT Goals (Current goals can be found in the Care Plan section)  Acute Rehab PT Goals Patient Stated Goal: to go home PT Goal Formulation:  All assessment and education complete, DC therapy    Frequency       Co-evaluation               AM-PAC PT "6 Clicks" Mobility  Outcome Measure Help needed turning from your back to your side while in a flat bed without using bedrails?: None Help needed moving from lying on your back to sitting on the side of a flat bed without using bedrails?: None Help needed moving to and from a bed to a chair (including a wheelchair)?: None Help needed standing up from a chair using your arms (e.g., wheelchair or bedside chair)?: None Help needed to walk in hospital room?: None Help needed climbing 3-5 steps with a railing? : None 6 Click Score: 24    End of Session   Activity Tolerance: Patient tolerated treatment well Patient left: in bed;with call bell/phone within reach;with family/visitor present Nurse Communication: Mobility status PT Visit Diagnosis: Muscle weakness (generalized) (M62.81);History of falling (Z91.81)    Time: 1610-9604 PT Time Calculation (min) (ACUTE ONLY): 24 min   Charges:   PT Evaluation $PT Eval Low Complexity: 1 Low PT Treatments $Therapeutic Activity: 8-22 mins        Natonya Finstad A. Gilford Rile PT, DPT [facility] Acute Rehabilitation Services   Kashara Blocher A Constantina Laseter 12/28/2021, 10:39 AM

## 2021-12-28 NOTE — Progress Notes (Signed)
OT Cancellation Note  Patient Details Name: Jamie Murillo MRN: [id number] DOB: 1950/05/04   Cancelled Treatment:    Reason Eval/Treat Not Completed: OT screened, no needs identified, will sign off. Chart reviewed, upon arrival pt eating breakfast with family in room, pt denies OT needs. Per PT, pt near baseline functional independence. Will sign off. Please reconsult if new needs arise.   Dessie Coma, M.S. OTR/L  12/28/21, 10:34 AM  ascom [phone]

## 2021-12-28 NOTE — Assessment & Plan Note (Signed)
Continue Protonix °

## 2021-12-28 NOTE — Assessment & Plan Note (Signed)
Not acutely exacerbated Continue Symbicort equivalent with DuoNebs as needed

## 2021-12-28 NOTE — Care Management Obs Status (Signed)
MEDICARE OBSERVATION STATUS NOTIFICATION   Patient Details  Name: Jamie Murillo MRN: [id number] Date of Birth: [birth year]   Medicare Observation Status Notification Given:  Yes    Shelbie Hutching, RN 12/28/2021, 10:38 AM

## 2021-12-28 NOTE — ED Notes (Signed)
Pt's resp reg/unlabored, skin dry and laying calmly on bed. Bed locked low, rail up and call bell within reach.

## 2021-12-28 NOTE — ED Notes (Signed)
Secure chat message sent to brenda morrison, np to notify per order that hr is less than 50. Pt resting, skin normal color warm and dry. Pt arouses easily and is alert and oriented x4 when aroused. Moves all extremities.

## 2021-12-28 NOTE — Assessment & Plan Note (Signed)
Clinically euvolemic Continue furosemide losartan and metoprolol Follow-up echocardiogram

## 2021-12-28 NOTE — Care Management CC44 (Signed)
Condition Code 44 Documentation Completed  Patient Details  Name: Jamie Murillo MRN: [id number] Date of Birth: Jun 07, 1950   Condition Code 44 given:  Yes Patient signature on Condition Code 44 notice:  Yes Documentation of 2 MD's agreement:  Yes Code 44 added to claim:  Yes    Shelbie Hutching, RN 12/28/2021, 10:38 AM

## 2021-12-28 NOTE — Assessment & Plan Note (Signed)
Renal function at baseline 

## 2021-12-28 NOTE — Assessment & Plan Note (Signed)
Transient neurologic deficit, possible TIA Presents with headache, also had slurred speech and confusion improved by arrival BP 217/86 on arrival improving to 176/73 with clonidine Head CT nonacute Continue losartan and metoprolol Will get MRI, echo, carotid Dopplers (patient had these planned as outpatient for evaluation of frequent falls) Headache pain control

## 2021-12-28 NOTE — ED Notes (Signed)
ECHO staff to bedside.

## 2021-12-28 NOTE — IPAL (Signed)
  Interdisciplinary Goals of Care Family Meeting   Date carried out: 12/28/2021  Location of the meeting: Bedside  Member's involved: Physician  Durable Power of Attorney or acting medical decision maker: patient    Discussion: We discussed goals of care for Jamie Murillo .    Code status:   Code Status: Full Code   Disposition: Continue current acute care  Time spent for the meeting: [place], MD  12/28/2021, 3:22 AM

## 2021-12-28 NOTE — Discharge Instructions (Signed)
Advised to follow-up with primary care physician in 1 week. Advised to continue current medications as prescribed. Metoprolol has been discontinued.  Advised to follow-up with cardiology before resuming metoprolol Patient will need outpatient echocardiogram.

## 2021-12-28 NOTE — Discharge Summary (Signed)
Physician Discharge Summary  Jamie Murillo:[id number] DOB: 1950-04-21 DOA: 12/27/2021  PCP: Bland Span, MD  Admit date: 12/27/2021  Discharge date: 12/28/2021  Admitted From: Home. Disposition:  Home  Recommendations for Outpatient Follow-up:  Follow up with PCP in 1-2 weeks. Please obtain BMP/CBC in one week. Advised to continue current medications as prescribed. Metoprolol has been discontinued. Advised to follow-up with cardiology before resuming metoprolol Patient will need outpatient echocardiogram.  Home Health: None Equipment/Devices: None  Discharge Condition: Stable CODE STATUS:Full code Diet recommendation: Heart Healthy   Brief Summary/ Hospital Course: Jamie Murillo is a 71 y.o. female with medical history significant for COPD, OSA on CPAP, CKD 3B, diastolic CHF, major depressive disorder, esophageal dysmotility disorder and frequent falls who presents to the ED for evaluation of headache, transient confusion and slurred speech and elevated blood pressures at home. Except for headache all other symptoms resolved by arrival. She denied visual disturbance, one-sided weakness,  numbness or tingling, chest pain, palpitations or shortness of breath.  Denies nausea, vomiting, fever or chills or cough.  Chart review reveals that patient is currently undergoing extensive workup for her frequent falls ordered by her gerontologist on 12/13.  Due to concern for polypharmacy, her blood pressure medication was adjusted and her metoprolol dose was decreased in half due to a concern for orthostasis.  Due to elevated blood pressures at home, patient doubled up on her medication over the course of the last couple days. ED course and data review: BP 217/86 on arrival with otherwise normal vitals.  EKG, showing sinus at 64 with no ischemic ST-T wave changes. Chest x-ray clear,  Head CT showing no acute intracranial abnormality but showed cerebral atrophy and microvascular disease  changes of the supratentorial brain. Patient was treated with clonidine 0.1 mg in the ED with improvement in blood pressure to 176/73. Patient was admitted for TIA workup.  All his symptoms resolved after arrival in the ED.  CT head unremarkable.  MRI without any acute abnormality.  Carotid duplex :No significant carotid stenosis noted on both sides.  Blood pressure has significantly improved.  Patient feels much improved and wants to be discharged.  Metoprolol was discontinued.  Advised to follow-up with cardiology before resuming metoprolol given bradycardia. Case was discussed with neurology and was agreeable to the discharge planning.  Patient is being discharged.  Discharge Diagnoses:  Principal Problem:   Hypertensive emergency without congestive heart failure Active Problems:   Chronic diastolic heart failure (HCC)   Stage 3b chronic kidney disease (HCC)   OSA on CPAP   Chronic obstructive pulmonary disease (HCC)   Major depressive disorder   Transient neurologic deficit   Frequent falls   Esophageal dysmotility    Discharge Instructions  Discharge Instructions     Call MD for:  difficulty breathing, headache or visual disturbances   Complete by: As directed    Call MD for:  persistant dizziness or light-headedness   Complete by: As directed    Call MD for:  persistant nausea and vomiting   Complete by: As directed    Diet - low sodium heart healthy   Complete by: As directed    Diet Carb Modified   Complete by: As directed    Discharge instructions   Complete by: As directed    Advised to follow-up with primary care physician in 1 week. Advised to continue current medications as prescribed. Metoprolol has been discontinued.  Advised to follow-up with cardiology before resuming metoprolol Patient will need  outpatient echocardiogram.   Increase activity slowly   Complete by: As directed       Allergies as of 12/28/2021       Reactions   Bee Venom Anaphylaxis    Desvenlafaxine Rash, Shortness Of Breath, Anaphylaxis   Diltiazem Hcl Anaphylaxis   Neomycin Anaphylaxis   Other Anaphylaxis   pistacchios   Pistachio Nut Extract Skin Test Anaphylaxis   Pneumococcal Polysaccharide Vaccine Dermatitis, Rash, Swelling   Prednisone Anaphylaxis   Tetracycline Anaphylaxis   Latex Rash   Ampicillin Hives   Then anaphylaxsis   Erythromycin Hives   Then anaphylaxsis   Influenza Vaccines    Patient has a history of anaphylaxis to neomycin and other agents of that class.     Lamotrigine Hives   Molds & Smuts Other (See Comments)   Penicillins Nausea And Vomiting   Pneumococcal Vaccine Hives   Septra [sulfamethoxazole-trimethoprim]    Leads to anaphylaxsis   Buspirone Rash   Leads to anaphylaxsis   Cortisone Rash   Imipramine Rash   Statins Rash   Statin induced myopathy   Tape Rash   BUSPAR. BUSPAR SEPTRA. SEPTRA   Ziprasidone Hcl Rash        Medication List     STOP taking these medications    amphetamine-dextroamphetamine 10 MG tablet Commonly known as: Adderall   D-5000 125 MCG (5000 UT) Tabs Generic drug: Cholecalciferol   furosemide 40 MG tablet Commonly known as: LASIX   hyoscyamine 0.125 MG SL tablet Commonly known as: LEVSIN SL   lisdexamfetamine 20 MG capsule Commonly known as: Vyvanse   metoprolol succinate 50 MG 24 hr tablet Commonly known as: TOPROL-XL   modafinil 200 MG tablet Commonly known as: PROVIGIL   topiramate 25 MG tablet Commonly known as: TOPAMAX       TAKE these medications    albuterol (2.5 MG/3ML) 0.083% nebulizer solution Commonly known as: PROVENTIL Inhale into the lungs. Reported on 02/03/2015   albuterol 108 (90 Base) MCG/ACT inhaler Commonly known as: ProAir HFA Inhale 1-2 puffs into the lungs every 4 (four) hours as needed for wheezing or shortness of breath.   allopurinol 100 MG tablet Commonly known as: ZYLOPRIM Take 100 mg by mouth.   Alpha-Lipoic Acid 300 MG Caps Take by  mouth.   clobetasol 0.05 % external solution Commonly known as: TEMOVATE Apply topically.   colchicine 0.6 MG tablet Take 0.6 mg by mouth daily.   estradiol 0.5 MG tablet Commonly known as: ESTRACE Take 0.5 mg by mouth daily.   ezetimibe 10 MG tablet Commonly known as: ZETIA Take 10 mg by mouth daily.   Ferrous Fumarate 324 (106 Fe) MG Tabs tablet Commonly known as: HEMOCYTE - 106 mg FE Take 1 tablet by mouth daily.   fluocinonide 0.05 % external solution Commonly known as: LIDEX Apply 1 Application topically daily.   gabapentin 300 MG capsule Commonly known as: NEURONTIN Take 400 mg by mouth at bedtime.   hydrochlorothiazide 12.5 MG tablet Commonly known as: HYDRODIURIL Take 1 tablet by mouth daily.   ketoconazole 2 % shampoo Commonly known as: NIZORAL Apply 1 Application topically 2 (two) times a week.   levothyroxine 100 MCG tablet Commonly known as: SYNTHROID Take 1 tablet by mouth daily. What changed: Another medication with the same name was removed. Continue taking this medication, and follow the directions you see here.   losartan 100 MG tablet Commonly known as: COZAAR Take 1 tablet by mouth every morning. What changed: Another medication with the  same name was removed. Continue taking this medication, and follow the directions you see here.   metFORMIN 500 MG tablet Commonly known as: GLUCOPHAGE Take 1 tablet by mouth daily.   montelukast 10 MG tablet Commonly known as: SINGULAIR   mupirocin ointment 2 % Commonly known as: BACTROBAN Apply 1 Application topically 3 (three) times daily.   pantoprazole 40 MG tablet Commonly known as: PROTONIX Take 40 mg by mouth daily.   pramipexole 1 MG tablet Commonly known as: MIRAPEX Take 1 mg by mouth 3 (three) times daily.   Spiriva HandiHaler 18 MCG inhalation capsule Generic drug: tiotropium Place 18 mcg into inhaler and inhale daily.   Symbicort 160-4.5 MCG/ACT inhaler Generic drug:  budesonide-formoterol inhale 2 puffs by mouth twice a day   vitamin C 250 MG tablet Commonly known as: ASCORBIC ACID Take 250 mg by mouth daily.        Allergies  Allergen Reactions   Bee Venom Anaphylaxis   Desvenlafaxine Rash, Shortness Of Breath and Anaphylaxis   Diltiazem Hcl Anaphylaxis   Neomycin Anaphylaxis   Other Anaphylaxis    pistacchios   P[place]achio Nut Extract Skin Test Anaphylaxis   Pneumococcal Polysaccharide Vaccine Dermatitis, Rash and Swelling   Prednisone Anaphylaxis   Tetracycline Anaphylaxis   Latex Rash   Ampicillin Hives    Then anaphylaxsis   Erythromycin Hives    Then anaphylaxsis   Influenza Vaccines     Patient has a history of anaphylaxis to neomycin and other agents of that class.     Lamotrigine Hives   Molds & Smuts Other (See Comments)   Penicillins Nausea And Vomiting   Pneumococcal Vaccine Hives   Septra [Sulfamethoxazole-Trimethoprim]     Leads to anaphylaxsis   Buspirone Rash    Leads to anaphylaxsis   Cortisone Rash   Imipramine Rash   Statins Rash    Statin induced myopathy   Tape Rash    BUSPAR. BUSPAR SEPTRA. SEPTRA   Ziprasidone Hcl Rash    Consultations: None   Procedures/Studies: US Carotid Bilateral (at [facility] and [facility] only)  Result Date: 12/28/2021 CLINICAL DATA:  [id number] Stroke (cerebrum) ([place]) [id number]. Hypertension. Hyperlipidemia. Diabetes. EXAM: BILATERAL CAROTID DUPLEX ULTRASOUND TECHNIQUE: Pearline Cables scale imaging, color Doppler and duplex ultrasound were performed of bilateral carotid and vertebral arteries in the neck. COMPARISON:  None Available. FINDINGS: Criteria: Quantification of carotid stenosis is based on velocity parameters that correlate the residual internal carotid diameter with NASCET-based stenosis levels, using the diameter of the distal internal carotid lumen as the denominator for stenosis measurement. The following velocity measurements were obtained: RIGHT ICA: 67 cm/sec CCA: 76 cm/sec SYSTOLIC ICA/CCA  RATIO:  0.9 ECA: 53 cm/sec LEFT ICA: 64 cm/sec CCA: 87 cm/sec SYSTOLIC ICA/CCA RATIO:  0.8 ECA: 91 cm/sec RIGHT CAROTID ARTERY: No atherosclerotic plaque. RIGHT VERTEBRAL ARTERY: Antegrade. LEFT CAROTID ARTERY:  No atherosclerotic plaque. LEFT VERTEBRAL ARTERY:  Antegrade. IMPRESSION: No stenosis of the carotid arteries. Electronically Signed   By: Iven Finn M.D.   On: 12/28/2021 03:26   MR BRAIN WO CONTRAST  Result Date: 12/28/2021 CLINICAL DATA:  Stroke suspected, history of hypertension, headache EXAM: MRI HEAD WITHOUT CONTRAST TECHNIQUE: Multiplanar, multiecho pulse sequences of the brain and surrounding structures were obtained without intravenous contrast. COMPARISON:  01/22/2016 FINDINGS: Brain: No restricted diffusion to suggest acute or subacute infarct.No acute hemorrhage, mass, mass effect, or midline shift. No hydrocephalus or extra-axial collection.No hemosiderin deposition to suggest remote hemorrhage.Normal pituitary and craniocervical junction.Mildly advanced cerebral volume loss for age,  with ex vacuo dilatation of the ventricles. Scattered T2 hyperintense signal in the periventricular white matter, likely the sequela of mild chronic small vessel ischemic disease. Vascular: Patent arterial flow voids. Skull and upper cervical spine: Normal marrow signal. Sinuses/Orbits: Clear paranasal sinuses.No acute finding in the orbits. Other: The mastoid air cells are well aerated. IMPRESSION: No acute intracranial process. No evidence of acute or subacute infarct. Electronically Signed   By: Merilyn Baba M.D.   On: 12/28/2021 01:26   DG Chest 2 View  Result Date: 12/27/2021 CLINICAL DATA:  cp/sob EXAM: CHEST - 2 VIEW COMPARISON:  05/05/2016. FINDINGS: The heart size and mediastinal contours are within normal limits. Both lungs are clear. No pneumothorax or pleural effusion. There are thoracic degenerative changes. IMPRESSION: No active cardiopulmonary disease. Electronically Signed   By:  Sammie Bench M.D.   On: 12/27/2021 18:03   CT Head Wo Contrast  Result Date: 12/27/2021 CLINICAL DATA:  History of hypertension with chest pain and headache. EXAM: CT HEAD WITHOUT CONTRAST TECHNIQUE: Contiguous axial images were obtained from the base of the skull through the vertex without intravenous contrast. RADIATION DOSE REDUCTION: This exam was performed according to the departmental dose-optimization program which includes automated exposure control, adjustment of the mA and/or kV according to patient size and/or use of iterative reconstruction technique. COMPARISON:  January 22, 2016 FINDINGS: Brain: There is mild cerebral atrophy with widening of the extra-axial spaces and ventricular dilatation. There are areas of decreased attenuation within the white matter tracts of the supratentorial brain, consistent with microvascular disease changes. Vascular: No hyperdense vessel or unexpected calcification. Skull: Normal. Negative for fracture or focal lesion. Sinuses/Orbits: No acute finding. Other: None. IMPRESSION: 1. No acute intracranial abnormality. 2. Generalized cerebral atrophy and microvascular disease changes of the supratentorial brain. Electronically Signed   By: Virgina Norfolk M.D.   On: 12/27/2021 17:55     Subjective: Patient was seen and examined at bedside.  Overnight events noted.   Patient reports doing much better,  blood pressure has improved.   Symptoms are resolved.  she wants to be discharged.  Discharge Exam: Vitals:   12/28/21 1100 12/28/21 1126  BP: (!) 136/46   Pulse: (!) 59   Resp: (!) 21   Temp:  97.7 F (36.5 C)  SpO2: 100%    Vitals:   12/28/21 1000 12/28/21 1030 12/28/21 1100 12/28/21 1126  BP: (!) 128/59 (!) 152/51 (!) 136/46   Pulse: (!) 58 (!) 55 (!) 59   Resp: 13 18 (!) 21   Temp:    97.7 F (36.5 C)  TempSrc:    Oral  SpO2: 98% 100% 100%   Weight:      Height:        General: Pt is alert, awake, not in acute  distress Cardiovascular: RRR, S1/S2 +, no rubs, no gallops Respiratory: CTA bilaterally, no wheezing, no rhonchi Abdominal: Soft, NT, ND, bowel sounds + Extremities: no edema, no cyanosis    The results of significant diagnostics from this hospitalization (including imaging, microbiology, ancillary and laboratory) are listed below for reference.     Microbiology: Recent Results (from the past 240 hour(s))  Resp panel by RT-PCR (RSV, Flu A&B, Covid) Anterior Nasal Swab     Status: None   Collection Time: 12/27/21 10:42 PM   Specimen: Anterior Nasal Swab  Result Value Ref Range Status   SARS Coronavirus 2 by RT PCR NEGATIVE NEGATIVE Final    Comment: (NOTE) SARS-CoV-2 target nucleic acids are NOT  DETECTED.  The SARS-CoV-2 RNA is generally detectable in upper respiratory specimens during the acute phase of infection. The lowest concentration of SARS-CoV-2 viral copies this assay can detect is 138 copies/mL. A negative result does not preclude SARS-Cov-2 infection and should not be used as the sole basis for treatment or other patient management decisions. A negative result may occur with  improper specimen collection/handling, submission of specimen other than nasopharyngeal swab, presence of viral mutation(s) within the areas targeted by this assay, and inadequate number of viral copies(<138 copies/mL). A negative result must be combined with clinical observations, patient history, and epidemiological information. The expected result is Negative.  Fact Sheet for Patients:  EntrepreneurPulse.com.au  Fact Sheet for Healthcare Providers:  IncredibleEmployment.be  This test is no t yet approved or cleared by the [place] FDA and  has been authorized for detection and/or diagnosis of SARS-CoV-2 by FDA under an Emergency Use Authorization (EUA). This EUA will remain  in effect (meaning this test can be used) for the duration of the COVID-19  declaration under Section 564(b)(1) of the Act, 21 U.S.C.section 360bbb-3(b)(1), unless the authorization is terminated  or revoked sooner.       Influenza A by PCR NEGATIVE NEGATIVE Final   Influenza B by PCR NEGATIVE NEGATIVE Final    Comment: (NOTE) The Xpert Xpress SARS-CoV-2/FLU/RSV plus assay is intended as an aid in the diagnosis of influenza from Nasopharyngeal swab specimens and should not be used as a sole basis for treatment. Nasal washings and aspirates are unacceptable for Xpert Xpress SARS-CoV-2/FLU/RSV testing.  Fact Sheet for Patients: EntrepreneurPulse.com.au  Fact Sheet for Healthcare Providers: IncredibleEmployment.be  This test is not yet approved or cleared by the [place] FDA and has been authorized for detection and/or diagnosis of SARS-CoV-2 by FDA under an Emergency Use Authorization (EUA). This EUA will remain in effect (meaning this test can be used) for the duration of the COVID-19 declaration under Section 564(b)(1) of the Act, 21 U.S.C. section 360bbb-3(b)(1), unless the authorization is terminated or revoked.     Resp Syncytial Virus by PCR NEGATIVE NEGATIVE Final    Comment: (NOTE) Fact Sheet for Patients: EntrepreneurPulse.com.au  Fact Sheet for Healthcare Providers: IncredibleEmployment.be  This test is not yet approved or cleared by the [place] FDA and has been authorized for detection and/or diagnosis of SARS-CoV-2 by FDA under an Emergency Use Authorization (EUA). This EUA will remain in effect (meaning this test can be used) for the duration of the COVID-19 declaration under Section 564(b)(1) of the Act, 21 U.S.C. section 360bbb-3(b)(1), unless the authorization is terminated or revoked.  Performed at [facility], [address] [place], [place] [address]      Labs: BNP (last 3 results) No results for input(s): "BNP" in the last 8760  hours. Basic Metabolic Panel: Recent Labs  Lab 12/27/21 1742  NA 142  K 4.9  CL 107  CO2 25  GLUCOSE 93  BUN 26*  CREATININE 1.30*  CALCIUM 8.9   Liver Function Tests: Recent Labs  Lab 12/27/21 1742  AST 22  ALT 19  ALKPHOS 98  BILITOT 0.7  PROT 7.3  ALBUMIN 4.2   No results for input(s): "LIPASE", "AMYLASE" in the last 168 hours. No results for input(s): "AMMONIA" in the last 168 hours. CBC: Recent Labs  Lab 12/27/21 1742  WBC 5.8  HGB 13.0  HCT 39.9  MCV 97.8  PLT 221   Cardiac Enzymes: No results for input(s): "CKTOTAL", "CKMB", "CKMBINDEX", "TROPONINI" in the  last 168 hours. BNP: Invalid input(s): "POCBNP" CBG: No results for input(s): "GLUCAP" in the last 168 hours. D-Dimer No results for input(s): "DDIMER" in the last 72 hours. Hgb A1c No results for input(s): "HGBA1C" in the last 72 hours. Lipid Profile No results for input(s): "CHOL", "HDL", "LDLCALC", "TRIG", "CHOLHDL", "LDLDIRECT" in the last 72 hours. Thyroid function studies No results for input(s): "TSH", "T4TOTAL", "T3FREE", "THYROIDAB" in the last 72 hours.  Invalid input(s): "FREET3" Anemia work up No results for input(s): "VITAMINB12", "FOLATE", "FERRITIN", "TIBC", "IRON", "RETICCTPCT" in the last 72 hours. Urinalysis    Component Value Date/Time   COLORURINE YELLOW (A) 01/15/2017 1802   APPEARANCEUR CLEAR (A) 01/15/2017 1802   LABSPEC 1.013 01/15/2017 1802   PHURINE 5.0 01/15/2017 1802   GLUCOSEU NEGATIVE 01/15/2017 1802   HGBUR NEGATIVE 01/15/2017 1802   BILIRUBINUR NEGATIVE 01/15/2017 1802   KETONESUR NEGATIVE 01/15/2017 1802   PROTEINUR NEGATIVE 01/15/2017 1802   NITRITE NEGATIVE 01/15/2017 1802   LEUKOCYTESUR NEGATIVE 01/15/2017 1802   Sepsis Labs Recent Labs  Lab 12/27/21 1742  WBC 5.8   Microbiology Recent Results (from the past 240 hour(s))  Resp panel by RT-PCR (RSV, Flu A&B, Covid) Anterior Nasal Swab     Status: None   Collection Time: 12/27/21 10:42 PM    Specimen: Anterior Nasal Swab  Result Value Ref Range Status   SARS Coronavirus 2 by RT PCR NEGATIVE NEGATIVE Final    Comment: (NOTE) SARS-CoV-2 target nucleic acids are NOT DETECTED.  The SARS-CoV-2 RNA is generally detectable in upper respiratory specimens during the acute phase of infection. The lowest concentration of SARS-CoV-2 viral copies this assay can detect is 138 copies/mL. A negative result does not preclude SARS-Cov-2 infection and should not be used as the sole basis for treatment or other patient management decisions. A negative result may occur with  improper specimen collection/handling, submission of specimen other than nasopharyngeal swab, presence of viral mutation(s) within the areas targeted by this assay, and inadequate number of viral copies(<138 copies/mL). A negative result must be combined with clinical observations, patient history, and epidemiological information. The expected result is Negative.  Fact Sheet for Patients:  EntrepreneurPulse.com.au  Fact Sheet for Healthcare Providers:  IncredibleEmployment.be  This test is no t yet approved or cleared by the [place] FDA and  has been authorized for detection and/or diagnosis of SARS-CoV-2 by FDA under an Emergency Use Authorization (EUA). This EUA will remain  in effect (meaning this test can be used) for the duration of the COVID-19 declaration under Section 564(b)(1) of the Act, 21 U.S.C.section 360bbb-3(b)(1), unless the authorization is terminated  or revoked sooner.       Influenza A by PCR NEGATIVE NEGATIVE Final   Influenza B by PCR NEGATIVE NEGATIVE Final    Comment: (NOTE) The Xpert Xpress SARS-CoV-2/FLU/RSV plus assay is intended as an aid in the diagnosis of influenza from Nasopharyngeal swab specimens and should not be used as a sole basis for treatment. Nasal washings and aspirates are unacceptable for Xpert Xpress  SARS-CoV-2/FLU/RSV testing.  Fact Sheet for Patients: EntrepreneurPulse.com.au  Fact Sheet for Healthcare Providers: IncredibleEmployment.be  This test is not yet approved or cleared by the [place] FDA and has been authorized for detection and/or diagnosis of SARS-CoV-2 by FDA under an Emergency Use Authorization (EUA). This EUA will remain in effect (meaning this test can be used) for the duration of the COVID-19 declaration under Section 564(b)(1) of the Act, 21 U.S.C. section 360bbb-3(b)(1), unless the authorization is terminated  or revoked.     Resp Syncytial Virus by PCR NEGATIVE NEGATIVE Final    Comment: (NOTE) Fact Sheet for Patients: EntrepreneurPulse.com.au  Fact Sheet for Healthcare Providers: IncredibleEmployment.be  This test is not yet approved or cleared by the [place] FDA and has been authorized for detection and/or diagnosis of SARS-CoV-2 by FDA under an Emergency Use Authorization (EUA). This EUA will remain in effect (meaning this test can be used) for the duration of the COVID-19 declaration under Section 564(b)(1) of the Act, 21 U.S.C. section 360bbb-3(b)(1), unless the authorization is terminated or revoked.  Performed at [facility], [address] [place], [place] [address]      Time coordinating discharge: Over 30 minutes  SIGNED:   Shawna Clamp, MD  [facility] 12/28/2021, 2:26 PM Pager   If 7PM-7AM, please contact night-coverage

## 2021-12-28 NOTE — ED Notes (Signed)
Report to [place], rn

## 2023-08-07 ENCOUNTER — Other Ambulatory Visit: Payer: Self-pay | Admitting: Medical Genetics

## 2023-08-09 ENCOUNTER — Other Ambulatory Visit
Admission: RE | Admit: 2023-08-09 | Discharge: 2023-08-09 | Disposition: A | Discharge status: Home / Self Care | Payer: Self-pay | Source: Ambulatory Visit | Attending: Medical Genetics | Admitting: Medical Genetics

## 2023-08-20 LAB — GENECONNECT MOLECULAR SCREEN: Genetic Analysis Overall Interpretation: NEGATIVE
